# Patient Record
Sex: Female | Born: 1950 | Race: White | Hispanic: No | Marital: Married | Smoking: Never smoker
Health system: Southern US, Community
[De-identification: ages and names within clinical notes are randomized; demographics above are authoritative.]

## PROBLEM LIST (undated history)

## (undated) DIAGNOSIS — E7800 Pure hypercholesterolemia, unspecified: Secondary | ICD-10-CM

## (undated) DIAGNOSIS — N200 Calculus of kidney: Secondary | ICD-10-CM

## (undated) DIAGNOSIS — R55 Syncope and collapse: Secondary | ICD-10-CM

## (undated) DIAGNOSIS — Z860100 Personal history of colon polyps, unspecified: Secondary | ICD-10-CM

## (undated) DIAGNOSIS — N8111 Cystocele, midline: Secondary | ICD-10-CM

## (undated) DIAGNOSIS — I639 Cerebral infarction, unspecified: Secondary | ICD-10-CM

## (undated) DIAGNOSIS — M1990 Unspecified osteoarthritis, unspecified site: Secondary | ICD-10-CM

## (undated) DIAGNOSIS — J309 Allergic rhinitis, unspecified: Secondary | ICD-10-CM

## (undated) DIAGNOSIS — K219 Gastro-esophageal reflux disease without esophagitis: Secondary | ICD-10-CM

## (undated) DIAGNOSIS — J45909 Unspecified asthma, uncomplicated: Secondary | ICD-10-CM

## (undated) DIAGNOSIS — L719 Rosacea, unspecified: Secondary | ICD-10-CM

## (undated) DIAGNOSIS — F411 Generalized anxiety disorder: Secondary | ICD-10-CM

## (undated) DIAGNOSIS — I10 Essential (primary) hypertension: Secondary | ICD-10-CM

## (undated) DIAGNOSIS — H8110 Benign paroxysmal vertigo, unspecified ear: Secondary | ICD-10-CM

## (undated) DIAGNOSIS — M19049 Primary osteoarthritis, unspecified hand: Secondary | ICD-10-CM

## (undated) HISTORY — DX: Syncope and collapse: R55

## (undated) HISTORY — DX: Unspecified osteoarthritis, unspecified site: M19.90

## (undated) HISTORY — DX: Generalized anxiety disorder: F41.1

## (undated) HISTORY — DX: Calculus of kidney: N20.0

## (undated) HISTORY — DX: Rosacea, unspecified: L71.9

## (undated) HISTORY — DX: Benign paroxysmal vertigo, unspecified ear: H81.10

## (undated) HISTORY — PX: PARTIAL HYSTERECTOMY: SHX80

## (undated) HISTORY — DX: Cerebral infarction, unspecified: I63.9

## (undated) HISTORY — DX: Personal history of colon polyps, unspecified: Z86.0100

## (undated) HISTORY — DX: Pure hypercholesterolemia, unspecified: E78.00

## (undated) HISTORY — PX: SHOULDER SURGERY: SHX246

## (undated) HISTORY — DX: Gastro-esophageal reflux disease without esophagitis: K21.9

## (undated) HISTORY — DX: Allergic rhinitis, unspecified: J30.9

## (undated) HISTORY — PX: BUNIONECTOMY: SHX129

## (undated) HISTORY — PX: KNEE SURGERY: SHX244

## (undated) HISTORY — DX: Unspecified asthma, uncomplicated: J45.909

## (undated) HISTORY — DX: Primary osteoarthritis, unspecified hand: M19.049

## (undated) HISTORY — PX: CARDIAC CATHETERIZATION: SHX172

## (undated) HISTORY — DX: Cystocele, midline: N81.11

## (undated) HISTORY — DX: Personal history of colonic polyps: Z86.010

## (undated) HISTORY — DX: Essential (primary) hypertension: I10

---

## 2005-10-01 ENCOUNTER — Ambulatory Visit: Payer: Self-pay | Admitting: Unknown Physician Specialty

## 2006-01-08 ENCOUNTER — Ambulatory Visit (HOSPITAL_COMMUNITY): Admission: RE | Admit: 2006-01-08 | Discharge: 2006-01-08 | Payer: Self-pay | Admitting: *Deleted

## 2009-02-04 DIAGNOSIS — H8110 Benign paroxysmal vertigo, unspecified ear: Secondary | ICD-10-CM | POA: Insufficient documentation

## 2010-06-01 DEATH — deceased

## 2011-01-19 ENCOUNTER — Ambulatory Visit: Payer: Self-pay | Admitting: Unknown Physician Specialty

## 2011-03-11 ENCOUNTER — Ambulatory Visit: Payer: Self-pay | Admitting: Unknown Physician Specialty

## 2012-02-29 ENCOUNTER — Ambulatory Visit: Payer: Self-pay | Admitting: Family Medicine

## 2012-03-07 ENCOUNTER — Ambulatory Visit: Payer: Self-pay | Admitting: Family Medicine

## 2012-03-07 LAB — HM MAMMOGRAPHY

## 2012-07-29 ENCOUNTER — Ambulatory Visit: Payer: Self-pay | Admitting: Unknown Physician Specialty

## 2012-07-29 LAB — HM COLONOSCOPY

## 2013-11-09 LAB — CBC AND DIFFERENTIAL
HEMATOCRIT: 40 % (ref 36–46)
HEMOGLOBIN: 13.6 g/dL (ref 12.0–16.0)
Platelets: 266 10*3/uL (ref 150–399)
WBC: 5.7 10^3/mL

## 2013-11-09 LAB — LIPID PANEL: Cholesterol: 4 mg/dL (ref 0–200)

## 2013-11-09 LAB — BASIC METABOLIC PANEL: Glucose: 90 mg/dL

## 2013-11-13 ENCOUNTER — Encounter: Payer: Self-pay | Admitting: *Deleted

## 2013-11-14 ENCOUNTER — Ambulatory Visit (INDEPENDENT_AMBULATORY_CARE_PROVIDER_SITE_OTHER): Payer: 59 | Admitting: Cardiovascular Disease

## 2013-11-14 ENCOUNTER — Encounter: Payer: Self-pay | Admitting: Cardiovascular Disease

## 2013-11-14 ENCOUNTER — Encounter (INDEPENDENT_AMBULATORY_CARE_PROVIDER_SITE_OTHER): Payer: Self-pay

## 2013-11-14 VITALS — BP 151/84 | HR 77 | Ht 68.0 in | Wt 172.2 lb

## 2013-11-14 DIAGNOSIS — R55 Syncope and collapse: Secondary | ICD-10-CM

## 2013-11-14 DIAGNOSIS — T671XXA Heat syncope, initial encounter: Secondary | ICD-10-CM

## 2013-11-14 DIAGNOSIS — R002 Palpitations: Secondary | ICD-10-CM

## 2013-11-14 DIAGNOSIS — Z5189 Encounter for other specified aftercare: Secondary | ICD-10-CM

## 2013-11-14 NOTE — Patient Instructions (Signed)
Your physician has requested that you have an echocardiogram. Echocardiography is a painless test that uses sound waves to create images of your heart. It provides your doctor with information about the size and shape of your heart and how well your heart's chambers and valves are working. This procedure takes approximately one hour. There are no restrictions for this procedure.  Your physician has requested that you have a carotid duplex. This test is an ultrasound of the carotid arteries in your neck. It looks at blood flow through these arteries that supply the brain with blood. Allow one hour for this exam. There are no restrictions or special instructions.  Your physician has recommended that you wear an event monitor. Event monitors are medical devices that record the heart's electrical activity. Doctors most often [place] these monitors to diagnose arrhythmias. Arrhythmias are problems with the speed or rhythm of the heartbeat. The monitor is a small, portable device. You can wear one while you do your normal daily activities. This is usually used to diagnose what is causing palpitations/syncope (passing out).   Your physician recommends that you schedule a follow-up appointment in:  As needed

## 2013-11-15 DIAGNOSIS — R55 Syncope and collapse: Secondary | ICD-10-CM | POA: Insufficient documentation

## 2013-11-15 NOTE — Assessment & Plan Note (Signed)
She is not orthostatic today. Most likely the syncope was vasovagal especially with her prior history of presyncope during blood draw. However, there was no preceding incidents or events leading to the current syncopal episode. Thus, I think it is important to exclude an arrhythmic events especially with her family history of atrial fibrillation. It has fibrillation by itself does not cause syncope. However, post conversion pauses can do that. I requested an echocardiogram, carotid Doppler and a 30 day outpatient telemetry for evaluation. If above workup is unremarkable, no further testing is indicated.

## 2013-11-15 NOTE — Progress Notes (Signed)
Primary care physician: Dr. Elease Hashimoto  HPI  This is a pleasant 63 year old female who was referred for evaluation of syncope. She reports previous cardiac evaluation in 2007 by Dr. Jenne Campus. She had cardiac catheterization done with no significant disease. Other than hypertension, she has no chronic medical conditions. She does have family history of atrial fibrillation as both parents had there. She is not a smoker. Last week she woke up at her usual time around 5:00 to get ready to go to work. She was walking to the kitchen and all the sudden she felt dizzy with loss of consciousness. She fell. Her husband came in she woke up after that. There was no seizure activities. No tongue biting or bowel/bladder incontinence. She was not confused after the event. She had no other symptoms preceding that. She did have abdominal cramping and had to have a bowel movement after the episode. She does complain of intermittent palpitations and occasional tachycardia but these usually brief and did not happen around the time of the syncopal episode. She denies any chest pain, shortness of breath or orthopnea. She is physically active and walks almost daily for exercise. She reports previous presyncopal episode few years ago during blood draw.  Allergies  Allergen Reactions  . Codeine      Current Outpatient Prescriptions on File Prior to Visit  Medication Sig Dispense Refill  . cholecalciferol (VITAMIN D) 1000 UNITS tablet Take 1,000 Units by mouth daily.      . cloNIDine (CATAPRES) 0.2 MG tablet Take 0.2 mg by mouth daily.       No current facility-administered medications on file prior to visit.     Past Medical History  Diagnosis Date  . Syncope and collapse   . Hypertension   . Anxiety state, unspecified   . Osteoarthrosis, unspecified whether generalized or localized, unspecified site   . Esophageal reflux   . Cystocele, midline   . Hx of colonic polyps   . Rosacea   . Calculus of kidney   .  Osteoarthrosis, unspecified whether generalized or localized, hand   . Allergic rhinitis, cause unspecified   . Pure hypercholesterolemia   . Asthma   . Benign paroxysmal positional vertigo   . Stroke      Past Surgical History  Procedure Laterality Date  . Cardiac catheterization      MC  . Partial hysterectomy    . Knee surgery    . Bunionectomy    . Knee surgery    . Shoulder surgery       Family History  Problem Relation Age of Onset  . Arrhythmia Mother   . Heart murmur Mother   . Arrhythmia Father      History   Social History  . Marital Status: Single    Spouse Name: N/A    Number of Children: N/A  . Years of Education: N/A   Occupational History  . Not on file.   Social History Main Topics  . Smoking status: Never Smoker   . Smokeless tobacco: Not on file  . Alcohol Use: Yes  . Drug Use: No  . Sexual Activity: Not on file   Other Topics Concern  . Not on file   Social History Narrative  . No narrative on file     ROS A 10 point review of system was performed. It is negative other than that mentioned in the history of present illness.   PHYSICAL EXAM   BP 151/84  Pulse 77  Ht 5'  8" (1.727 m)  Wt 172 lb 4 oz (78.132 kg)  BMI 26.20 kg/m2 Constitutional: She is oriented to person, place, and time. She appears well-developed and well-nourished. No distress.  HENT: No nasal discharge.  Head: Normocephalic and atraumatic.  Eyes: Pupils are equal and round. No discharge.  Neck: Normal range of motion. Neck supple. No JVD present. No thyromegaly present.  Cardiovascular: Normal rate, regular rhythm, normal heart sounds. Exam reveals no gallop and no friction rub. No murmur heard.  Pulmonary/Chest: Effort normal and breath sounds normal. No stridor. No respiratory distress. She has no wheezes. She has no rales. She exhibits no tenderness.  Abdominal: Soft. Bowel sounds are normal. She exhibits no distension. There is no tenderness. There is no  rebound and no guarding.  Musculoskeletal: Normal range of motion. She exhibits no edema and no tenderness.  Neurological: She is alert and oriented to person, place, and time. Coordination normal.  Skin: Skin is warm and dry. No rash noted. She is not diaphoretic. No erythema. No pallor.  Psychiatric: She has a normal mood and affect. Her behavior is normal. Judgment and thought content normal.     [id number]  Rhythm  -  Nonspecific T-abnormality.   ABNORMAL     ASSESSMENT AND PLAN

## 2013-11-20 DIAGNOSIS — I498 Other specified cardiac arrhythmias: Secondary | ICD-10-CM

## 2013-11-23 ENCOUNTER — Encounter (INDEPENDENT_AMBULATORY_CARE_PROVIDER_SITE_OTHER): Payer: 59

## 2013-11-23 ENCOUNTER — Other Ambulatory Visit: Payer: Self-pay

## 2013-11-23 ENCOUNTER — Other Ambulatory Visit (INDEPENDENT_AMBULATORY_CARE_PROVIDER_SITE_OTHER): Payer: 59

## 2013-11-23 DIAGNOSIS — I6529 Occlusion and stenosis of unspecified carotid artery: Secondary | ICD-10-CM

## 2013-11-23 DIAGNOSIS — R55 Syncope and collapse: Secondary | ICD-10-CM

## 2013-11-29 ENCOUNTER — Encounter: Payer: Self-pay | Admitting: Cardiovascular Disease

## 2013-11-29 ENCOUNTER — Ambulatory Visit: Payer: Self-pay | Admitting: Cardiovascular Disease

## 2013-11-29 DIAGNOSIS — E785 Hyperlipidemia, unspecified: Secondary | ICD-10-CM

## 2013-11-29 MED ORDER — ATORVASTATIN CALCIUM 10 MG PO TABS: 10.0000 mg | ORAL_TABLET | Freq: Every day | ORAL | Status: DC

## 2013-11-29 NOTE — Telephone Encounter (Signed)
Dr. Kirke CorinArida reviewed labs from patient  Per written order from Dr. Kirke CorinArida: Her 10 year risk of CV disease is 8.8% (which is above 7.5%) Thus she should consider atorvastatin 10 mg daily and repeat labs in 6 weeks   Patient verbalized understanding

## 2013-12-13 ENCOUNTER — Telehealth: Payer: Self-pay

## 2013-12-13 MED ORDER — ATORVASTATIN CALCIUM 10 MG PO TABS
10.0000 mg | ORAL_TABLET | Freq: Every day | ORAL | Status: DC
Start: 2013-12-13 — End: 2014-10-17

## 2013-12-13 NOTE — Telephone Encounter (Signed)
Patient called and stated that the electrodes are breaking her skin out horribly  She has no where else to put them  She feels that she has had at least two episodes while wearing her monitor   She wonders if it can be sent back one week early

## 2013-12-13 NOTE — Telephone Encounter (Signed)
Please see note below. 

## 2013-12-13 NOTE — Telephone Encounter (Signed)
Pt states she is currently wearing a monitor and "has no more skin left" has gotten new sensitive skin leads and she has lots of bumps and her skin is "raw". States she has a week left. Please call.

## 2013-12-13 NOTE — Telephone Encounter (Signed)
LVM 10/14 

## 2013-12-14 NOTE — Telephone Encounter (Signed)
Informed patient that Dr. Kirke CorinArida said she send her monitor back early  Patient verbalized understanding

## 2013-12-14 NOTE — Telephone Encounter (Signed)
That's fine

## 2013-12-14 NOTE — Telephone Encounter (Signed)
She would like to stop it now.  Is that ok?

## 2013-12-14 NOTE — Telephone Encounter (Signed)
Ok. Wear it as long as she can tolerate.

## 2014-01-08 ENCOUNTER — Ambulatory Visit (INDEPENDENT_AMBULATORY_CARE_PROVIDER_SITE_OTHER): Payer: 59

## 2014-01-08 ENCOUNTER — Other Ambulatory Visit: Payer: Self-pay

## 2014-01-08 DIAGNOSIS — R55 Syncope and collapse: Secondary | ICD-10-CM

## 2014-01-08 DIAGNOSIS — R002 Palpitations: Secondary | ICD-10-CM

## 2014-01-10 ENCOUNTER — Other Ambulatory Visit (INDEPENDENT_AMBULATORY_CARE_PROVIDER_SITE_OTHER): Payer: 59

## 2014-01-10 DIAGNOSIS — E785 Hyperlipidemia, unspecified: Secondary | ICD-10-CM

## 2014-01-11 LAB — HEPATIC FUNCTION PANEL
ALK PHOS: 93 IU/L (ref 39–117)
ALT: 29 IU/L (ref 0–32)
AST: 18 IU/L (ref 0–40)
Albumin: 4.4 g/dL (ref 3.6–4.8)
Bilirubin, Direct: 0.1 mg/dL (ref 0.00–0.40)
Total Bilirubin: 0.3 mg/dL (ref 0.0–1.2)
Total Protein: 6.6 g/dL (ref 6.0–8.5)

## 2014-01-11 LAB — LIPID PANEL
CHOLESTEROL TOTAL: 146 mg/dL (ref 100–199)
Chol/HDL Ratio: 2.7 ratio units (ref 0.0–4.4)
HDL: 55 mg/dL (ref >39)
LDL Calculated: 71 mg/dL (ref 0–99)
TRIGLYCERIDES: 102 mg/dL (ref 0–149)
VLDL CHOLESTEROL CAL: 20 mg/dL (ref 5–40)

## 2014-01-15 ENCOUNTER — Telehealth: Payer: Self-pay

## 2014-01-15 NOTE — Telephone Encounter (Signed)
See result note.  

## 2014-01-15 NOTE — Telephone Encounter (Signed)
Pt returning call regarding lab results 

## 2014-06-24 NOTE — Op Note (Signed)
PATIENT NAME:  Maria Carrillo, Maria Carrillo MR#:  [id number] DATE OF BIRTH:  [birth year]  DATE OF PROCEDURE:  03/11/2011  PREOPERATIVE DIAGNOSIS: Torn medial meniscus right knee and possible torn lateral meniscus.   POSTOPERATIVE DIAGNOSIS: Torn medial meniscus, right knee along with multiple chondral lesions.   PROCEDURE: Arthroscopic partial medial meniscectomy plus debridement of chondral lesions.   SURGEON: Alda BertholdHarold B. Arval Brandstetter Jr., MD  ANESTHESIA: General.   HISTORY: Patient had a long history of right knee pain. Her plain films did not reveal any significant abnormality. MRI was consistent with a torn medial meniscus and possible lateral meniscus tear. Patient was ultimately brought in for arthroscopy due to her persistent symptoms despite conservative treatment.   DESCRIPTION OF PROCEDURE: Patient was taken to the Operating Room where satisfactory general anesthesia was achieved. A tourniquet and legholder were applied to the right thigh and then the left lower extremity was supported with a well legholder. The right knee was prepped and draped in the usual fashion for an arthroscopic procedure. An inflow cannula was inserted superomedially and the joint was distended with lactated Ringer's. We used the Mitek fluid pump to facilitate joint distention.   The scope was introduced through an inferolateral puncture wound and a probe through an inferomedial puncture wound.   Inspection of the medial compartment revealed that the patient did have a tear of the posterior horn of the medial meniscus. It looked like she had had an inferior leaf tear with a remaining flap. I went ahead and resected the torn portions of the posterior horn of medial meniscus using basket biter and a motorized resector. After the rim was contoured, I turned my attention to the chondral surfaces. The patient had a grade 3 lesion in the mid weight-bearing portion of the medial femoral condyle. This was debrided with a Turbowhisker and then  coblated with a [employer] ArthroCare wand. Inspection of the intercondylar notch revealed that the cruciates were intact. Inspection of the lateral compartment revealed a little fraying along the inner border of the lateral meniscus. The patient had about a 5 x 7 chondral lesion grade 3, almost grade 4, chondral lesion in the mid weight-bearing portion of the lateral femoral condyle. This was debrided with a Turbowhisker and then I used a curette to remove the roughened articular cartilage in the base of the lesion. I then used a small synovial resector to further debride the lesion and then I microfractured the lesion with a 45 degrees awl. Further debridement was performed with a Turbowhisker.   I used Turbowhisker to debride the frayed inner portion of the lateral meniscus.   I then introduced the scope through the intercondylar notch into the posterior recess of the medial and lateral compartments. No additional pathology was noted. The trochlear groove was reasonably smooth. Inspection of the patella was primarily carried out from the superomedial portal. There were grade 2 to 3 changes in the central portion of the patella. I debrided this with a Turbowhisker and then coblated this area with a [employer] ArthroCare wand.   I then decompressed the fluid in the joint through the cannulas. The cannulas were removed and the puncture wounds were closed with 3-0 nylon in vertical mattress fashion. Several milliliters of 0.5% Marcaine with epinephrine were injected about each puncture wound then Betadine was applied to the wounds followed by a sterile dressing and an ice pack.   The patient was then awakened and transferred to her stretcher bed. She was taken to recovery room in satisfactory condition.  The tourniquet incidentally was not inflated during the course of the procedure. Blood loss was negligible.   ____________________________ Alda Berthold., MD hbk:cms D: 03/11/2011 09:49:46  ET T: 03/11/2011 10:09:37 ET JOB#: [id number]  cc: Alda Berthold., MD, <Dictator> Randon Goldsmith, Montez Hageman MD ELECTRONICALLY SIGNED 03/29/2011 15:07

## 2014-07-02 ENCOUNTER — Other Ambulatory Visit: Payer: Self-pay

## 2014-07-02 DIAGNOSIS — Z1231 Encounter for screening mammogram for malignant neoplasm of breast: Secondary | ICD-10-CM

## 2014-07-17 ENCOUNTER — Ambulatory Visit
Admission: RE | Admit: 2014-07-17 | Discharge: 2014-07-17 | Disposition: A | Discharge status: Home / Self Care | Payer: 59 | Source: Ambulatory Visit | Attending: Obstetrics & Gynecology | Admitting: Obstetrics & Gynecology

## 2014-07-17 DIAGNOSIS — Z1231 Encounter for screening mammogram for malignant neoplasm of breast: Secondary | ICD-10-CM | POA: Diagnosis present

## 2014-07-23 ENCOUNTER — Other Ambulatory Visit: Payer: Self-pay | Admitting: Obstetrics & Gynecology

## 2014-07-23 DIAGNOSIS — N6489 Other specified disorders of breast: Secondary | ICD-10-CM

## 2014-07-23 DIAGNOSIS — R928 Other abnormal and inconclusive findings on diagnostic imaging of breast: Secondary | ICD-10-CM

## 2014-07-24 ENCOUNTER — Ambulatory Visit
Admission: RE | Admit: 2014-07-24 | Discharge: 2014-07-24 | Disposition: A | Discharge status: Home / Self Care | Payer: 59 | Source: Ambulatory Visit | Attending: Obstetrics & Gynecology | Admitting: Obstetrics & Gynecology

## 2014-07-24 ENCOUNTER — Ambulatory Visit: Payer: 59

## 2014-07-24 DIAGNOSIS — N6489 Other specified disorders of breast: Secondary | ICD-10-CM

## 2014-07-24 DIAGNOSIS — R928 Other abnormal and inconclusive findings on diagnostic imaging of breast: Secondary | ICD-10-CM | POA: Insufficient documentation

## 2014-08-30 ENCOUNTER — Ambulatory Visit (INDEPENDENT_AMBULATORY_CARE_PROVIDER_SITE_OTHER): Payer: 59 | Admitting: Family Medicine

## 2014-08-30 ENCOUNTER — Encounter: Payer: Self-pay | Admitting: Family Medicine

## 2014-08-30 VITALS — BP 118/72 | HR 68 | Temp 97.7°F | Resp 16 | Ht 68.0 in | Wt 177.0 lb

## 2014-08-30 DIAGNOSIS — E038 Other specified hypothyroidism: Secondary | ICD-10-CM | POA: Diagnosis not present

## 2014-08-30 DIAGNOSIS — M1990 Unspecified osteoarthritis, unspecified site: Secondary | ICD-10-CM | POA: Insufficient documentation

## 2014-08-30 DIAGNOSIS — M19049 Primary osteoarthritis, unspecified hand: Secondary | ICD-10-CM | POA: Insufficient documentation

## 2014-08-30 DIAGNOSIS — E039 Hypothyroidism, unspecified: Secondary | ICD-10-CM | POA: Insufficient documentation

## 2014-08-30 DIAGNOSIS — K219 Gastro-esophageal reflux disease without esophagitis: Secondary | ICD-10-CM | POA: Insufficient documentation

## 2014-08-30 DIAGNOSIS — R202 Paresthesia of skin: Secondary | ICD-10-CM | POA: Diagnosis not present

## 2014-08-30 DIAGNOSIS — L719 Rosacea, unspecified: Secondary | ICD-10-CM | POA: Insufficient documentation

## 2014-08-30 DIAGNOSIS — I10 Essential (primary) hypertension: Secondary | ICD-10-CM | POA: Insufficient documentation

## 2014-08-30 DIAGNOSIS — F419 Anxiety disorder, unspecified: Secondary | ICD-10-CM | POA: Insufficient documentation

## 2014-08-30 DIAGNOSIS — D649 Anemia, unspecified: Secondary | ICD-10-CM

## 2014-08-30 NOTE — Progress Notes (Signed)
Subjective:    Patient ID: Maria Carrillo, female    DOB: 1950-10-21, 64 y.o.   MRN: [id number]  Anemia Presents for initial visit. The condition has lasted for 2 days. Symptoms include bruises/bleeds easily (bruises easily), malaise/fatigue, paresthesias and pica (Pt states she likes to chew ice). There has been no abdominal pain, anorexia, confusion, fever, leg swelling, light-headedness, pallor, palpitations or weight loss. Signs of blood loss that are not present include hematemesis, hematochezia, melena and menorrhagia. There is no past history of FOBT (at OB/GYN- was negative April or May 2016.).  Pt tried to give blood on Tuesday, and could not give due to low hgb. Pt states she has been feeling fatigued lately, and is requesting labs to be checked.    Review of Systems  Constitutional: Positive for malaise/fatigue. Negative for fever and weight loss.  Cardiovascular: Negative for palpitations.  Gastrointestinal: Negative for abdominal pain, melena, hematochezia, anorexia and hematemesis.  Genitourinary: Negative for menorrhagia.  Skin: Negative for pallor.  Neurological: Positive for paresthesias. Negative for light-headedness.  Hematological: Bruises/bleeds easily (bruises easily).  Psychiatric/Behavioral: Negative for confusion.   BP 118/72 mmHg  Pulse 68  Temp(Src) 97.7 F (36.5 C) (Oral)  Resp 16  Ht 5\' 8"  (1.727 m)  Wt 177 lb (80.287 kg)  BMI 26.92 kg/m2   Patient Active Problem List   Diagnosis Date Noted  . Anxiety 08/30/2014  . Acid reflux 08/30/2014  . BP (high blood pressure) 08/30/2014  . Arthritis of hand, degenerative 08/30/2014  . Arthritis, degenerative 08/30/2014  . Subclinical hypothyroidism 08/30/2014  . Rosacea 08/30/2014  . Faintness 11/15/2013  . Benign paroxysmal positional nystagmus 02/04/2009  . Asthma, exogenous 11/15/2008  . Allergic rhinitis 10/10/2008  . History of colon polyps 10/10/2008  . Hypercholesteremia 10/10/2008   Past Medical  History  Diagnosis Date  . Syncope and collapse   . Hypertension   . Anxiety state, unspecified   . Osteoarthrosis, unspecified whether generalized or localized, unspecified site   . Esophageal reflux   . Cystocele, midline   . Hx of colonic polyps   . Rosacea   . Calculus of kidney   . Osteoarthrosis, unspecified whether generalized or localized, hand   . Allergic rhinitis, cause unspecified   . Pure hypercholesterolemia   . Asthma   . Benign paroxysmal positional vertigo   . Stroke    Current Outpatient Prescriptions on File Prior to Visit  Medication Sig  . atorvastatin (LIPITOR) 10 MG tablet Take 1 tablet (10 mg total) by mouth daily.  . cholecalciferol (VITAMIN D) 1000 UNITS tablet Take 1,000 Units by mouth daily.  . cloNIDine (CATAPRES) 0.2 MG tablet Take 0.2 mg by mouth daily.   No current facility-administered medications on file prior to visit.   Allergies  Allergen Reactions  . Codeine Itching   Past Surgical History  Procedure Laterality Date  . Cardiac catheterization      MC  . Partial hysterectomy    . Knee surgery    . Bunionectomy    . Knee surgery    . Shoulder surgery     History   Social History  . Marital Status: Married    Spouse Name: N/A  . Number of Children: N/A  . Years of Education: N/A   Occupational History  . Not on file.   Social History Main Topics  . Smoking status: Never Smoker   . Smokeless tobacco: Never Used  . Alcohol Use: Yes     Comment: occasionally  .  Drug Use: No  . Sexual Activity: Not on file   Other Topics Concern  . Not on file   Social History Narrative   Family History  Problem Relation Age of Onset  . Arrhythmia Mother   . Heart murmur Mother   . Arrhythmia Father   . Breast cancer Maternal Aunt       Objective:   Physical Exam  Constitutional: She is oriented to person, place, and time. She appears well-developed and well-nourished.  Neurological: She is alert and oriented to person, place,  and time.  Psychiatric: She has a normal mood and affect. Her behavior is normal.   BP 118/72 mmHg  Pulse 68  Temp(Src) 97.7 F (36.5 C) (Oral)  Resp 16  Ht  (1.727 m)  Wt 177 lb (80.287 kg)  BMI 26.92 kg/m2        Assessment & Plan:   1. Anemia, unspecified anemia type Will check labs.  - CBC with Differential/Platelet - Ferritin - Iron and TIBC  2. Paresthesia  Will check labs.  - Vitamin B12  3. Subclinical hypothyroidism Will check labs.   - TSH  Lorie Phenix, MD

## 2014-08-31 ENCOUNTER — Telehealth: Payer: Self-pay

## 2014-08-31 LAB — CBC WITH DIFFERENTIAL/PLATELET
Basophils Absolute: 0.1 10*3/uL (ref 0.0–0.2)
Basos: 1 %
EOS (ABSOLUTE): 0.2 10*3/uL (ref 0.0–0.4)
Eos: 3 %
Hematocrit: 38 % (ref 34.0–46.6)
Hemoglobin: 12.8 g/dL (ref 11.1–15.9)
IMMATURE GRANULOCYTES: 0 %
Immature Grans (Abs): 0 10*3/uL (ref 0.0–0.1)
LYMPHS: 35 %
Lymphocytes Absolute: 2.4 10*3/uL (ref 0.7–3.1)
MCH: 28.9 pg (ref 26.6–33.0)
MCHC: 33.7 g/dL (ref 31.5–35.7)
MCV: 86 fL (ref 79–97)
MONOCYTES: 8 %
Monocytes Absolute: 0.5 10*3/uL (ref 0.1–0.9)
Neutrophils Absolute: 3.6 10*3/uL (ref 1.4–7.0)
Neutrophils: 53 %
PLATELETS: 260 10*3/uL (ref 150–379)
RBC: 4.43 x10E6/uL (ref 3.77–5.28)
RDW: 13.6 % (ref 12.3–15.4)
WBC: 6.8 10*3/uL (ref 3.4–10.8)

## 2014-08-31 LAB — VITAMIN B12: Vitamin B-12: 360 pg/mL (ref 211–946)

## 2014-08-31 LAB — FERRITIN: FERRITIN: 103 ng/mL (ref 15–150)

## 2014-08-31 LAB — IRON AND TIBC
Iron Saturation: 19 % (ref 15–55)
Iron: 60 ug/dL (ref 27–139)
Total Iron Binding Capacity: 315 ug/dL (ref 250–450)
UIBC: 255 ug/dL (ref 118–369)

## 2014-08-31 LAB — TSH: TSH: 3.06 u[IU]/mL (ref 0.450–4.500)

## 2014-08-31 NOTE — Telephone Encounter (Signed)
Advised pt of lab results. Pt verbally acknowledges understanding. Aunisty Reali Drozdowski, CMA   

## 2014-08-31 NOTE — Telephone Encounter (Signed)
-----   Message from Lorie PhenixNancy Maloney, MD sent at 08/31/2014  6:53 AM EDT ----- Labs stable.  Not anemic.  Iron level ok.  B12 is low normal. Would add B12 1000 mcg daily. Have a great holiday weekend. Thanks- Dr. Elease HashimotoMaloney

## 2014-10-17 ENCOUNTER — Other Ambulatory Visit: Payer: Self-pay | Admitting: Cardiovascular Disease

## 2015-01-09 ENCOUNTER — Other Ambulatory Visit: Payer: Self-pay | Admitting: Orthopedic Surgery

## 2015-01-09 ENCOUNTER — Ambulatory Visit
Admission: RE | Admit: 2015-01-09 | Discharge: 2015-01-09 | Disposition: A | Discharge status: Home / Self Care | Payer: 59 | Source: Ambulatory Visit | Attending: Orthopedic Surgery | Admitting: Orthopedic Surgery

## 2015-01-09 DIAGNOSIS — R52 Pain, unspecified: Secondary | ICD-10-CM

## 2015-01-09 DIAGNOSIS — M25551 Pain in right hip: Secondary | ICD-10-CM | POA: Diagnosis not present

## 2015-07-04 ENCOUNTER — Other Ambulatory Visit: Payer: Self-pay | Admitting: Obstetrics & Gynecology

## 2015-07-04 DIAGNOSIS — Z1231 Encounter for screening mammogram for malignant neoplasm of breast: Secondary | ICD-10-CM

## 2015-07-24 ENCOUNTER — Ambulatory Visit (INDEPENDENT_AMBULATORY_CARE_PROVIDER_SITE_OTHER): Payer: 59 | Admitting: Family Medicine

## 2015-07-24 ENCOUNTER — Encounter: Payer: Self-pay | Admitting: Family Medicine

## 2015-07-24 VITALS — BP 138/82 | HR 80 | Temp 98.2°F | Resp 16 | Ht 68.0 in | Wt 180.0 lb

## 2015-07-24 DIAGNOSIS — E7800 Pure hypercholesterolemia, unspecified: Secondary | ICD-10-CM

## 2015-07-24 DIAGNOSIS — I10 Essential (primary) hypertension: Secondary | ICD-10-CM | POA: Diagnosis not present

## 2015-07-24 DIAGNOSIS — N951 Menopausal and female climacteric states: Secondary | ICD-10-CM | POA: Diagnosis not present

## 2015-07-24 DIAGNOSIS — E039 Hypothyroidism, unspecified: Secondary | ICD-10-CM

## 2015-07-24 DIAGNOSIS — R319 Hematuria, unspecified: Secondary | ICD-10-CM

## 2015-07-24 DIAGNOSIS — Z0000 Encounter for general adult medical examination without abnormal findings: Secondary | ICD-10-CM | POA: Diagnosis not present

## 2015-07-24 DIAGNOSIS — E038 Other specified hypothyroidism: Secondary | ICD-10-CM

## 2015-07-24 LAB — POCT URINALYSIS DIPSTICK
Bilirubin, UA: NEGATIVE
GLUCOSE UA: NEGATIVE
Ketones, UA: NEGATIVE
Leukocytes, UA: NEGATIVE
NITRITE UA: NEGATIVE
PH UA: 7
PROTEIN UA: NEGATIVE
SPEC GRAV UA: 1.01
UROBILINOGEN UA: 0.2

## 2015-07-24 MED ORDER — HYDROCHLOROTHIAZIDE 12.5 MG PO TABS: 12.5000 mg | ORAL_TABLET | Freq: Every day | ORAL | Status: AC

## 2015-07-24 NOTE — Progress Notes (Signed)
Patient ID: Maria Carrillo, female   DOB: 1951-02-13, 65 y.o.   MRN: [id number]       Patient: Maria Carrillo, Female    DOB: 1950-09-13, 65 y.o.   MRN: [id number] Visit Date: 07/24/2015  Today's Provider: Margarita Rana, MD   Chief Complaint  Patient presents with  . Annual Exam   Subjective:    Annual physical exam [name] is a 65 y.o. female who presents today for health maintenance and complete physical. She feels well. She reports exercising daily. She reports she is sleeping well.  05/03/14 CPE 07/24/14 Mammogram-BI-RADS 1 07/29/12 Colonocopy-diverticulosis, recheck in 5 years, Dr. Vira Agar  -----------------------------------------------------------------   Hypertension, follow-up:  BP Readings from Last 3 Encounters:  07/24/15 138/82  08/30/14 118/72  11/14/13 151/84   Patient reports that she a few weeks ago at GYN and was told that her BP was elevated (140/80's per pt.) and to follow-up with PCP. She was last seen for hypertension 1 years ago.  BP at that visit was 118/72. Management changes since that visit include no changes. She reports excellent compliance with treatment. She is not having side effects. She is exercising. She is adherent to low salt diet.   Outside blood pressures are . She is experiencing none.  Patient denies chest pain and lower extremity edema.   Cardiovascular risk factors include none.  Use of agents associated with hypertension: none.    Weight trend: stable Wt Readings from Last 3 Encounters:  07/24/15 180 lb (81.647 kg)  08/30/14 177 lb (80.287 kg)  11/14/13 172 lb 4 oz (78.132 kg)   Current diet: in general, a "healthy" diet   ------------------------------------------------------------------------  Review of Systems  Constitutional: Negative.   HENT: Negative.   Eyes: Negative.   Respiratory: Negative.   Cardiovascular: Negative.   Gastrointestinal: Negative.   Endocrine: Negative.   Genitourinary: Negative.     Musculoskeletal: Negative.   Skin: Negative.   Allergic/Immunologic: Negative.   Neurological: Negative.   Hematological: Negative.   Psychiatric/Behavioral: Negative.     Social History      She  reports that she has never smoked. She has never used smokeless tobacco. She reports that she drinks alcohol. She reports that she does not use illicit drugs.       Social History   Social History  . Marital Status: Married    Spouse Name: N/A  . Number of Children: N/A  . Years of Education: N/A   Social History Main Topics  . Smoking status: Never Smoker   . Smokeless tobacco: Never Used  . Alcohol Use: Yes     Comment: ocasisonally  . Drug Use: No  . Sexual Activity: Not Asked   Other Topics Concern  . None   Social History Narrative    Past Medical History  Diagnosis Date  . Syncope and collapse   . Hypertension   . Anxiety state, unspecified   . Osteoarthrosis, unspecified whether generalized or localized, unspecified site   . Esophageal reflux   . Cystocele, midline   . Hx of colonic polyps   . Rosacea   . Calculus of kidney   . Osteoarthrosis, unspecified whether generalized or localized, hand   . Allergic rhinitis, cause unspecified   . Pure hypercholesterolemia   . Asthma   . Benign paroxysmal positional vertigo   . Stroke [facility])      Patient Active Problem List   Diagnosis Date Noted  . Anxiety 08/30/2014  . Acid reflux 08/30/2014  .  BP (high blood pressure) 08/30/2014  . Arthritis of hand, degenerative 08/30/2014  . Arthritis, degenerative 08/30/2014  . Subclinical hypothyroidism 08/30/2014  . Rosacea 08/30/2014  . Paresthesia 08/30/2014  . Anemia 08/30/2014  . Faintness 11/15/2013  . Benign paroxysmal positional nystagmus 02/04/2009  . Asthma, exogenous 11/15/2008  . Allergic rhinitis 10/10/2008  . History of colon polyps 10/10/2008  . Hypercholesteremia 10/10/2008    Past Surgical History  Procedure Laterality Date  . Cardiac  catheterization      [facility]  . Partial hysterectomy    . Knee surgery    . Bunionectomy    . Knee surgery    . Shoulder surgery      Family History        Family Status  Relation Status Death Age  . Mother Alive   . Father Deceased   . Sister Alive   . Brother Alive         Her family history includes Arrhythmia in her father and mother; Breast cancer in her maternal aunt; Heart murmur in her mother.    Allergies  Allergen Reactions  . Codeine Itching    Previous Medications   ATORVASTATIN (LIPITOR) 10 MG TABLET    Take 1 tablet by mouth  daily   CHOLECALCIFEROL (VITAMIN D) 1000 UNITS TABLET    Take 1,000 Units by mouth daily.   CLONIDINE (CATAPRES) 0.2 MG TABLET    Take 0.2 mg by mouth daily.   COENZYME Q-10 100 MG CAPSULE    Take 100 mg by mouth daily.     Patient Care Team: Margarita Rana, MD as PCP - General (Family Medicine)     Objective:   Vitals: BP 138/82 mmHg  Pulse 80  Temp(Src) 98.2 F (36.8 C) (Oral)  Resp 16  Ht 5' 8"  (1.727 m)  Wt 180 lb (81.647 kg)  BMI 27.38 kg/m2   Physical Exam  Constitutional: She is oriented to person, place, and time. She appears well-developed and well-nourished.  HENT:  Head: Normocephalic and atraumatic.  Right Ear: Tympanic membrane, external ear and ear canal normal.  Left Ear: Tympanic membrane, external ear and ear canal normal.  Nose: Nose normal.  Mouth/Throat: Uvula is midline, oropharynx is clear and moist and mucous membranes are normal.  Eyes: Conjunctivae, EOM and lids are normal. Pupils are equal, round, and reactive to light.  Neck: Trachea normal and normal range of motion. Neck supple. Carotid bruit is not present. No thyroid mass and no thyromegaly present.  Cardiovascular: Normal rate, regular rhythm and normal heart sounds.   Pulmonary/Chest: Effort normal and breath sounds normal.  Abdominal: Soft. Normal appearance and bowel sounds are normal. There is no hepatosplenomegaly. There is no tenderness.    Musculoskeletal: Normal range of motion.  Lymphadenopathy:    She has no cervical adenopathy.    She has no axillary adenopathy.  Neurological: She is alert and oriented to person, place, and time. She has normal strength. No cranial nerve deficit.  Skin: Skin is warm, dry and intact.  Psychiatric: She has a normal mood and affect. Her speech is normal and behavior is normal. Judgment and thought content normal. Cognition and memory are normal.    Depression Screen PHQ 2/9 Scores 07/24/2015  PHQ - 2 Score 0    Assessment & Plan:     Routine Health Maintenance and Physical Exam  Exercise Activities and Dietary recommendations Goals    None      Immunization History  Administered Date(s) Administered  .  Tdap 03/02/2006       1. Annual physical exam Stable. Patient advised to continue eating healthy and exercise daily. - POCT urinalysis dipstick  2. Hematuria F/U pending lab report. - Urine Microscopic  3. Essential hypertension Not at goal. Patient started on HCTZ 12.5 mg as below. Patient to call in 3 week to recheck met c after medication changes. Also, monitor bp and call if worsens or does not improve.   - hydrochlorothiazide (HYDRODIURIL) 12.5 MG tablet; Take 1 tablet (12.5 mg total) by mouth daily.  Dispense: 90 tablet; Refill: 3 - CBC with Differential/Platelet - Comprehensive metabolic panel  4. Subclinical hypothyroidism - TSH  5. Hypercholesteremia - Lipid Panel With LDL/HDL Ratio  6. Hot flashes, menopausal Stable. Patient advised to discontinue clonidine and see if symptoms return as is only taking once a day and may be causing rebound issues.     Patient seen and examined by Dr. Jerrell Belfast, and note scribed by Philbert Riser. Dimas, CMA. I have reviewed the document for accuracy and completeness and I agree with above. Jerrell Belfast, MD   --------------------------------------------------------------------

## 2015-07-24 NOTE — Patient Instructions (Signed)
Discontinue clonidine 0.2 mg and start hydrochlorothiazide 12.5 mg daily. Please call in 3 weeks for lab order.

## 2015-07-25 ENCOUNTER — Telehealth: Payer: Self-pay

## 2015-07-25 LAB — URINALYSIS, MICROSCOPIC ONLY
BACTERIA UA: NONE SEEN
CASTS: NONE SEEN /LPF

## 2015-07-25 NOTE — Telephone Encounter (Signed)
Left message to call back  

## 2015-07-25 NOTE — Telephone Encounter (Signed)
Advised patient of results.  

## 2015-07-25 NOTE — Telephone Encounter (Signed)
-----   Message from Lorie PhenixNancy Maloney, MD sent at 07/25/2015  7:24 AM EDT ----- Urine normal. Thanks.

## 2015-07-26 ENCOUNTER — Ambulatory Visit
Admission: RE | Admit: 2015-07-26 | Discharge: 2015-07-26 | Disposition: A | Discharge status: Home / Self Care | Payer: 59 | Source: Ambulatory Visit | Attending: Obstetrics & Gynecology | Admitting: Obstetrics & Gynecology

## 2015-07-26 ENCOUNTER — Other Ambulatory Visit: Payer: Self-pay | Admitting: Obstetrics & Gynecology

## 2015-07-26 DIAGNOSIS — Z1231 Encounter for screening mammogram for malignant neoplasm of breast: Secondary | ICD-10-CM | POA: Diagnosis present

## 2015-07-27 LAB — COMPREHENSIVE METABOLIC PANEL
ALT: 24 IU/L (ref 0–32)
AST: 15 IU/L (ref 0–40)
Albumin/Globulin Ratio: 1.7 (ref 1.2–2.2)
Albumin: 4.2 g/dL (ref 3.6–4.8)
Alkaline Phosphatase: 92 IU/L (ref 39–117)
BUN/Creatinine Ratio: 12 (ref 12–28)
BUN: 10 mg/dL (ref 8–27)
Bilirubin Total: 0.3 mg/dL (ref 0.0–1.2)
CALCIUM: 9.3 mg/dL (ref 8.7–10.3)
CO2: 24 mmol/L (ref 18–29)
CREATININE: 0.86 mg/dL (ref 0.57–1.00)
Chloride: 101 mmol/L (ref 96–106)
GFR calc Af Amer: 83 mL/min/{1.73_m2} (ref >59)
GFR, EST NON AFRICAN AMERICAN: 72 mL/min/{1.73_m2} (ref >59)
GLOBULIN, TOTAL: 2.5 g/dL (ref 1.5–4.5)
Glucose: 87 mg/dL (ref 65–99)
Potassium: 4.9 mmol/L (ref 3.5–5.2)
SODIUM: 140 mmol/L (ref 134–144)
TOTAL PROTEIN: 6.7 g/dL (ref 6.0–8.5)

## 2015-07-27 LAB — CBC WITH DIFFERENTIAL/PLATELET
Basophils Absolute: 0.1 10*3/uL (ref 0.0–0.2)
Basos: 1 %
EOS (ABSOLUTE): 0.2 10*3/uL (ref 0.0–0.4)
EOS: 4 %
HEMATOCRIT: 38.1 % (ref 34.0–46.6)
HEMOGLOBIN: 12.6 g/dL (ref 11.1–15.9)
IMMATURE GRANS (ABS): 0 10*3/uL (ref 0.0–0.1)
IMMATURE GRANULOCYTES: 0 %
LYMPHS: 35 %
Lymphocytes Absolute: 2.1 10*3/uL (ref 0.7–3.1)
MCH: 28.9 pg (ref 26.6–33.0)
MCHC: 33.1 g/dL (ref 31.5–35.7)
MCV: 87 fL (ref 79–97)
MONOCYTES: 6 %
MONOS ABS: 0.4 10*3/uL (ref 0.1–0.9)
Neutrophils Absolute: 3.2 10*3/uL (ref 1.4–7.0)
Neutrophils: 54 %
Platelets: 272 10*3/uL (ref 150–379)
RBC: 4.36 x10E6/uL (ref 3.77–5.28)
RDW: 13.6 % (ref 12.3–15.4)
WBC: 5.9 10*3/uL (ref 3.4–10.8)

## 2015-07-27 LAB — LIPID PANEL WITH LDL/HDL RATIO
Cholesterol, Total: 156 mg/dL (ref 100–199)
HDL: 50 mg/dL (ref >39)
LDL CALC: 77 mg/dL (ref 0–99)
LDL/HDL RATIO: 1.5 ratio (ref 0.0–3.2)
TRIGLYCERIDES: 144 mg/dL (ref 0–149)
VLDL Cholesterol Cal: 29 mg/dL (ref 5–40)

## 2015-07-27 LAB — TSH: TSH: 5.86 u[IU]/mL — ABNORMAL HIGH (ref 0.450–4.500)

## 2015-07-30 ENCOUNTER — Telehealth: Payer: Self-pay

## 2015-07-30 NOTE — Telephone Encounter (Signed)
-----   Message from Lorie PhenixNancy Maloney, MD sent at 07/27/2015  2:05 PM EDT ----- Labs stable. Thyroid slightly low but not enough to recommend treatment. Recheck TSH and T4 in 3 to 6 months.  Thanks.

## 2015-07-30 NOTE — Telephone Encounter (Signed)
Pt advised as directed below.   Thanks,   -Laura  

## 2015-08-01 ENCOUNTER — Encounter: Payer: Self-pay | Admitting: Physician Assistant

## 2015-08-01 ENCOUNTER — Encounter: Payer: Self-pay | Admitting: Family Medicine

## 2015-08-01 ENCOUNTER — Ambulatory Visit (INDEPENDENT_AMBULATORY_CARE_PROVIDER_SITE_OTHER): Payer: 59 | Admitting: Physician Assistant

## 2015-08-01 VITALS — BP 124/80 | HR 111 | Temp 98.1°F | Resp 16 | Ht 68.0 in | Wt 177.0 lb

## 2015-08-01 DIAGNOSIS — I456 Pre-excitation syndrome: Secondary | ICD-10-CM | POA: Diagnosis not present

## 2015-08-01 DIAGNOSIS — R002 Palpitations: Secondary | ICD-10-CM | POA: Diagnosis not present

## 2015-08-01 DIAGNOSIS — I10 Essential (primary) hypertension: Secondary | ICD-10-CM

## 2015-08-01 NOTE — Progress Notes (Addendum)
Patient: Maria Carrillo Female    DOB: [birth year]   65 y.o.   MRN: [id number] Visit Date: 08/01/2015  Today's Provider: Margaretann Loveless, PA-C   Chief Complaint  Patient presents with  . Anxiety  . Palpitations   Subjective:      Patient was taken off Clonidine and discontinued taking it on Sunday 07/28/2014. Since stopping Clonidine she has had symptoms of palpitations and anxiety. Having symptoms of racing heart, no appetite and trouble sleeping.  Palpitations  This is a new problem. The current episode started in the past 7 days (4 days). The problem occurs constantly. The problem has been unchanged. The symptoms are aggravated by unknown. Associated symptoms include anxiety, dizziness, an irregular heartbeat, malaise/fatigue and nausea. Pertinent negatives include no chest fullness, chest pain, diaphoresis, fever, near-syncope, numbness, shortness of breath, syncope, vomiting or weakness. She has tried breathing exercises for the symptoms. The treatment provided no relief. Her past medical history is significant for anxiety.   No family history of CAD, but both parents have atrial fibrillation.   Allergies  Allergen Reactions  . Codeine Itching   Previous Medications   ATORVASTATIN (LIPITOR) 10 MG TABLET    Take 1 tablet by mouth  daily   CHOLECALCIFEROL (VITAMIN D) 1000 UNITS TABLET    Take 1,000 Units by mouth daily.   COENZYME Q-10 100 MG CAPSULE    Take 100 mg by mouth daily.    HYDROCHLOROTHIAZIDE (HYDRODIURIL) 12.5 MG TABLET    Take 1 tablet (12.5 mg total) by mouth daily.    Review of Systems  Constitutional: Positive for malaise/fatigue and appetite change. Negative for fever, chills, diaphoresis and fatigue.  Respiratory: Negative for chest tightness and shortness of breath.   Cardiovascular: Positive for palpitations. Negative for chest pain, leg swelling, syncope and near-syncope.  Gastrointestinal: Positive for nausea. Negative for vomiting and abdominal  pain.  Neurological: Positive for dizziness and light-headedness. Negative for weakness and numbness.  Psychiatric/Behavioral: The patient is nervous/anxious.     Social History  Substance Use Topics  . Smoking status: Never Smoker   . Smokeless tobacco: Never Used  . Alcohol Use: Yes     Comment: ocasisonally   Objective:   BP 124/80 mmHg  Pulse 111  Temp(Src) 98.1 F (36.7 C) (Oral)  Resp 16  Ht  (1.727 m)  Wt 177 lb (80.287 kg)  BMI 26.92 kg/m2  SpO2 98%  Physical Exam  Constitutional: She appears well-developed and well-nourished. No distress.  HENT:  Head: Normocephalic and atraumatic.  Right Ear: Hearing, tympanic membrane, external ear and ear canal normal.  Left Ear: Hearing, tympanic membrane, external ear and ear canal normal.  Nose: Nose normal.  Mouth/Throat: Uvula is midline, oropharynx is clear and moist and mucous membranes are normal. No oropharyngeal exudate, posterior oropharyngeal edema or posterior oropharyngeal erythema.  Eyes: Conjunctivae are normal. Pupils are equal, round, and reactive to light. Right eye exhibits no discharge. Left eye exhibits no discharge. No scleral icterus.  Neck: Normal range of motion. Neck supple. Carotid bruit is not present. No tracheal deviation present. No thyromegaly present.  Cardiovascular: Normal rate, regular rhythm, normal heart sounds and intact distal pulses.  Exam reveals no gallop and no friction rub.   No murmur heard. Pulmonary/Chest: Effort normal and breath sounds normal. No stridor. No respiratory distress. She has no wheezes. She has no rales.  Lymphadenopathy:    She has no cervical adenopathy.  Skin: Skin is warm  and dry. She is not diaphoretic.  Psychiatric: She has a normal mood and affect. Her behavior is normal. Judgment and thought content normal.  Vitals reviewed.       Assessment & Plan:     1. Short PR-normal QRS complex syndrome EKG was just barely positive for short PR (118) and with  palpitations and tachycardia may be fluctuating into sinus tach arrhythmia. EKG also had diffuse ST depression throughout. She has seen cardiology approx 3 years ago for similar symptoms and did wear a 30-day holter monitor which was normal. She cannot remember which cardiologist she had seen. I will refer her back to cardiology for further evaluation. - Ambulatory referral to Cardiology  2. Palpitations I do feel her symptoms are most closely related to withdrawal from the clonidine. She is going to continue a clonidine at night and HCTZ in the morning to see if symptoms improve. She is to call if symptoms worsen. Will refer to cardiology to make sure she is not going into arrhythmias from the short PR.  - EKG 12-Lead - Ambulatory referral to Cardiology  3. Essential hypertension See above medical treatment plan. - Ambulatory referral to Cardiology  Addend: I called patient today to see if symptoms improved after taking clonidine last night and they have. BP and pulse are stable. Anxiety and palpitations have subsided as well.      Margaretann LovelessJennifer M Basha Krygier, PA-C  [facility]

## 2015-08-01 NOTE — Patient Instructions (Signed)
Short PR Syndrome:  A short A-V conduction time, whether present with normal or with abnormal QRS complex, is associated with an increased incidence of paroxysmal rapid heart action. There are a considerable number of patients who have a short P-R interval, normal QRS complex and bouts of tachycardia. They are usually females, in middle life, devoid of organic heart disease and exhibit a snapping apical first heart sound. They do not demonstrate any of the features of anomalous A-V conduction. Evidence is presented suggesting the operation of endocrine and autonomic nervous system factors in the genesis both of the short P-R interval and the tachycardia.

## 2015-08-01 NOTE — Telephone Encounter (Signed)
Pt is requesting to be seen today due to palpitations. Pt aware of Dr. Santiago BurMaloney's plan. Still would like to be seen because she "is not feeling right". Dr. Santiago BurMaloney's schedule is booked. [facility] 1:45 [facility] Allene DillonEmily Drozdowski, CMA

## 2015-08-01 NOTE — Telephone Encounter (Signed)
Pt called back saying she has been having issues with anxiety, concentration.  Having rapid HR.  Not sleeping , lost of appitite.  She took her blood pressure and it was 157/73  pulse 98.  Please advise  [phone]  Thanks Barth Kirkseri,

## 2015-08-21 ENCOUNTER — Telehealth: Payer: Self-pay | Admitting: Physician Assistant

## 2015-08-21 ENCOUNTER — Other Ambulatory Visit: Payer: Self-pay | Admitting: Physician Assistant

## 2015-08-21 DIAGNOSIS — R002 Palpitations: Secondary | ICD-10-CM

## 2015-08-21 NOTE — Telephone Encounter (Signed)
Patient is requesting labs. Lab order printed.

## 2015-08-22 ENCOUNTER — Other Ambulatory Visit: Payer: Self-pay | Admitting: Physician Assistant

## 2015-08-23 ENCOUNTER — Telehealth: Payer: Self-pay

## 2015-08-23 LAB — CBC WITH DIFFERENTIAL/PLATELET
BASOS: 1 %
Basophils Absolute: 0.1 10*3/uL (ref 0.0–0.2)
EOS (ABSOLUTE): 0.2 10*3/uL (ref 0.0–0.4)
EOS: 4 %
HEMOGLOBIN: 12.8 g/dL (ref 11.1–15.9)
Hematocrit: 37.4 % (ref 34.0–46.6)
IMMATURE GRANS (ABS): 0 10*3/uL (ref 0.0–0.1)
IMMATURE GRANULOCYTES: 0 %
LYMPHS: 35 %
Lymphocytes Absolute: 1.9 10*3/uL (ref 0.7–3.1)
MCH: 28.4 pg (ref 26.6–33.0)
MCHC: 34.2 g/dL (ref 31.5–35.7)
MCV: 83 fL (ref 79–97)
MONOCYTES: 5 %
Monocytes Absolute: 0.3 10*3/uL (ref 0.1–0.9)
NEUTROS ABS: 3 10*3/uL (ref 1.4–7.0)
NEUTROS PCT: 55 %
PLATELETS: 277 10*3/uL (ref 150–379)
RBC: 4.5 x10E6/uL (ref 3.77–5.28)
RDW: 13.1 % (ref 12.3–15.4)
WBC: 5.5 10*3/uL (ref 3.4–10.8)

## 2015-08-23 LAB — COMPREHENSIVE METABOLIC PANEL
A/G RATIO: 1.6 (ref 1.2–2.2)
ALBUMIN: 4.5 g/dL (ref 3.6–4.8)
ALT: 19 IU/L (ref 0–32)
AST: 13 IU/L (ref 0–40)
Alkaline Phosphatase: 85 IU/L (ref 39–117)
BUN / CREAT RATIO: 14 (ref 12–28)
BUN: 9 mg/dL (ref 8–27)
Bilirubin Total: 0.3 mg/dL (ref 0.0–1.2)
CALCIUM: 9.8 mg/dL (ref 8.7–10.3)
CO2: 24 mmol/L (ref 18–29)
Chloride: 98 mmol/L (ref 96–106)
Creatinine, Ser: 0.64 mg/dL (ref 0.57–1.00)
GFR, EST AFRICAN AMERICAN: 109 mL/min/{1.73_m2} (ref >59)
GFR, EST NON AFRICAN AMERICAN: 95 mL/min/{1.73_m2} (ref >59)
Globulin, Total: 2.9 g/dL (ref 1.5–4.5)
Glucose: 83 mg/dL (ref 65–99)
POTASSIUM: 4.4 mmol/L (ref 3.5–5.2)
Sodium: 143 mmol/L (ref 134–144)
TOTAL PROTEIN: 7.4 g/dL (ref 6.0–8.5)

## 2015-08-23 LAB — SPECIMEN STATUS REPORT

## 2015-08-23 LAB — TSH: TSH: 4.5 u[IU]/mL (ref 0.450–4.500)

## 2015-08-23 NOTE — Telephone Encounter (Signed)
Patient advised as directed below.  Thanks,  -Sharmayne Jablon 

## 2015-08-23 NOTE — Telephone Encounter (Signed)
Pt returned call. Thanks TNP °

## 2015-08-23 NOTE — Telephone Encounter (Signed)
-----   Message from Margaretann LovelessJennifer M Burnette, PA-C sent at 08/23/2015  8:53 AM EDT ----- Thyroid is back in normal range. CBC is normal. CMP has not resulted yet so will f/u once with those results once received.

## 2015-08-23 NOTE — Telephone Encounter (Signed)
LMTCB  Thanks,  -Shakala Marlatt 

## 2015-08-23 NOTE — Telephone Encounter (Signed)
-----   Message from Jennifer M Burnette, PA-C sent at 08/23/2015  4:47 PM EDT ----- CMP was completely normal. 

## 2015-08-29 ENCOUNTER — Other Ambulatory Visit: Payer: Self-pay | Admitting: Cardiovascular Disease

## 2015-08-30 MED ORDER — ATORVASTATIN CALCIUM 10 MG PO TABS: ORAL_TABLET | ORAL | Status: DC

## 2015-12-13 ENCOUNTER — Other Ambulatory Visit: Payer: Self-pay

## 2015-12-13 MED ORDER — ATORVASTATIN CALCIUM 10 MG PO TABS: ORAL_TABLET | ORAL | 0 refills | Status: DC

## 2015-12-27 ENCOUNTER — Other Ambulatory Visit: Payer: Self-pay | Admitting: *Deleted

## 2015-12-27 ENCOUNTER — Telehealth: Payer: Self-pay | Admitting: Cardiovascular Disease

## 2015-12-27 NOTE — Telephone Encounter (Signed)
Lmom to call our office for a carotid u/s (2 yr f/u). 

## 2015-12-31 MED ORDER — ATORVASTATIN CALCIUM 10 MG PO TABS: ORAL_TABLET | ORAL | 1 refills | Status: DC

## 2016-01-13 ENCOUNTER — Other Ambulatory Visit: Payer: Self-pay | Admitting: Cardiovascular Disease

## 2016-01-13 DIAGNOSIS — I6523 Occlusion and stenosis of bilateral carotid arteries: Secondary | ICD-10-CM

## 2016-02-10 ENCOUNTER — Encounter: Payer: Self-pay | Admitting: *Deleted

## 2016-05-16 ENCOUNTER — Other Ambulatory Visit: Payer: Self-pay | Admitting: Obstetrics & Gynecology

## 2016-06-21 ENCOUNTER — Other Ambulatory Visit: Payer: Self-pay | Admitting: Physician Assistant

## 2016-07-28 ENCOUNTER — Ambulatory Visit (INDEPENDENT_AMBULATORY_CARE_PROVIDER_SITE_OTHER): Payer: 59 | Admitting: Physician Assistant

## 2016-07-28 ENCOUNTER — Encounter: Payer: Self-pay | Admitting: Physician Assistant

## 2016-07-28 VITALS — BP 120/72 | HR 68 | Temp 98.8°F | Resp 16 | Ht 68.0 in | Wt 178.0 lb

## 2016-07-28 DIAGNOSIS — Z0000 Encounter for general adult medical examination without abnormal findings: Secondary | ICD-10-CM | POA: Diagnosis not present

## 2016-07-28 DIAGNOSIS — Z1159 Encounter for screening for other viral diseases: Secondary | ICD-10-CM | POA: Diagnosis not present

## 2016-07-28 DIAGNOSIS — Z6827 Body mass index (BMI) 27.0-27.9, adult: Secondary | ICD-10-CM | POA: Diagnosis not present

## 2016-07-28 DIAGNOSIS — I10 Essential (primary) hypertension: Secondary | ICD-10-CM

## 2016-07-28 DIAGNOSIS — Z114 Encounter for screening for human immunodeficiency virus [HIV]: Secondary | ICD-10-CM | POA: Diagnosis not present

## 2016-07-28 DIAGNOSIS — D649 Anemia, unspecified: Secondary | ICD-10-CM | POA: Diagnosis not present

## 2016-07-28 DIAGNOSIS — E039 Hypothyroidism, unspecified: Secondary | ICD-10-CM

## 2016-07-28 DIAGNOSIS — Z860100 Personal history of colon polyps, unspecified: Secondary | ICD-10-CM

## 2016-07-28 DIAGNOSIS — Z131 Encounter for screening for diabetes mellitus: Secondary | ICD-10-CM

## 2016-07-28 DIAGNOSIS — E7800 Pure hypercholesterolemia, unspecified: Secondary | ICD-10-CM

## 2016-07-28 DIAGNOSIS — Z23 Encounter for immunization: Secondary | ICD-10-CM

## 2016-07-28 DIAGNOSIS — E038 Other specified hypothyroidism: Secondary | ICD-10-CM

## 2016-07-28 NOTE — Patient Instructions (Signed)
Health Maintenance for Postmenopausal Women Menopause is a normal process in which your reproductive ability comes to an end. This process happens gradually over a span of months to years, usually between the ages of 33 and 38. Menopause is complete when you have missed 12 consecutive menstrual periods. It is important to talk with your health care provider about some of the most common conditions that affect postmenopausal women, such as heart disease, cancer, and bone loss (osteoporosis). Adopting a healthy lifestyle and getting preventive care can help to promote your health and wellness. Those actions can also lower your chances of developing some of these common conditions. What should I know about menopause? During menopause, you may experience a number of symptoms, such as:  Moderate-to-severe hot flashes.  Night sweats.  Decrease in sex drive.  Mood swings.  Headaches.  Tiredness.  Irritability.  Memory problems.  Insomnia. Choosing to treat or not to treat menopausal changes is an individual decision that you make with your health care provider. What should I know about hormone replacement therapy and supplements? Hormone therapy products are effective for treating symptoms that are associated with menopause, such as hot flashes and night sweats. Hormone replacement carries certain risks, especially as you become older. If you are thinking about using estrogen or estrogen with progestin treatments, discuss the benefits and risks with your health care provider. What should I know about heart disease and stroke? Heart disease, heart attack, and stroke become more likely as you age. This may be due, in part, to the hormonal changes that your body experiences during menopause. These can affect how your body processes dietary fats, triglycerides, and cholesterol. Heart attack and stroke are both medical emergencies. There are many things that you can do to help prevent heart disease  and stroke:  Have your blood pressure checked at least every 1-2 years. High blood pressure causes heart disease and increases the risk of stroke.  If you are 48-61 years old, ask your health care provider if you should take aspirin to prevent a heart attack or a stroke.  Do not use any tobacco products, including cigarettes, chewing tobacco, or electronic cigarettes. If you need help quitting, ask your health care provider.  It is important to eat a healthy diet and maintain a healthy weight.  Be sure to include plenty of vegetables, fruits, low-fat dairy products, and lean protein.  Avoid eating foods that are high in solid fats, added sugars, or salt (sodium).  Get regular exercise. This is one of the most important things that you can do for your health.  Try to exercise for at least 150 minutes each week. The type of exercise that you do should increase your heart rate and make you sweat. This is known as moderate-intensity exercise.  Try to do strengthening exercises at least twice each week. Do these in addition to the moderate-intensity exercise.  Know your numbers.Ask your health care provider to check your cholesterol and your blood glucose. Continue to have your blood tested as directed by your health care provider. What should I know about cancer screening? There are several types of cancer. Take the following steps to reduce your risk and to catch any cancer development as early as possible. Breast Cancer  Practice breast self-awareness.  This means understanding how your breasts normally appear and feel.  It also means doing regular breast self-exams. Let your health care provider know about any changes, no matter how small.  If you are 40 or older,  have a clinician do a breast exam (clinical breast exam or CBE) every year. Depending on your age, family history, and medical history, it may be recommended that you also have a yearly breast X-ray (mammogram).  If you  have a family history of breast cancer, talk with your health care provider about genetic screening.  If you are at high risk for breast cancer, talk with your health care provider about having an MRI and a mammogram every year.  Breast cancer (BRCA) gene test is recommended for women who have family members with BRCA-related cancers. Results of the assessment will determine the need for genetic counseling and BRCA1 and for BRCA2 testing. BRCA-related cancers include these types:  Breast. This occurs in males or females.  Ovarian.  Tubal. This may also be called fallopian tube cancer.  Cancer of the abdominal or pelvic lining (peritoneal cancer).  Prostate.  Pancreatic. Cervical, Uterine, and Ovarian Cancer  Your health care provider may recommend that you be screened regularly for cancer of the pelvic organs. These include your ovaries, uterus, and vagina. This screening involves a pelvic exam, which includes checking for microscopic changes to the surface of your cervix (Pap test).  For women ages 21-65, health care providers may recommend a pelvic exam and a Pap test every three years. For women ages 23-65, they may recommend the Pap test and pelvic exam, combined with testing for human papilloma virus (HPV), every five years. Some types of HPV increase your risk of cervical cancer. Testing for HPV may also be done on women of any age who have unclear Pap test results.  Other health care providers may not recommend any screening for nonpregnant women who are considered low risk for pelvic cancer and have no symptoms. Ask your health care provider if a screening pelvic exam is right for you.  If you have had past treatment for cervical cancer or a condition that could lead to cancer, you need Pap tests and screening for cancer for at least 20 years after your treatment. If Pap tests have been discontinued for you, your risk factors (such as having a new sexual partner) need to be reassessed  to determine if you should start having screenings again. Some women have medical problems that increase the chance of getting cervical cancer. In these cases, your health care provider may recommend that you have screening and Pap tests more often.  If you have a family history of uterine cancer or ovarian cancer, talk with your health care provider about genetic screening.  If you have vaginal bleeding after reaching menopause, tell your health care provider.  There are currently no reliable tests available to screen for ovarian cancer. Lung Cancer  Lung cancer screening is recommended for adults 99-83 years old who are at high risk for lung cancer because of a history of smoking. A yearly low-dose CT scan of the lungs is recommended if you:  Currently smoke.  Have a history of at least 30 pack-years of smoking and you currently smoke or have quit within the past 15 years. A pack-year is smoking an average of one pack of cigarettes per day for one year. Yearly screening should:  Continue until it has been 15 years since you quit.  Stop if you develop a health problem that would prevent you from having lung cancer treatment. Colorectal Cancer  This type of cancer can be detected and can often be prevented.  Routine colorectal cancer screening usually begins at age 72 and continues  through age 75.  If you have risk factors for colon cancer, your health care provider may recommend that you be screened at an earlier age.  If you have a family history of colorectal cancer, talk with your health care provider about genetic screening.  Your health care provider may also recommend using home test kits to check for hidden blood in your stool.  A small camera at the end of a tube can be used to examine your colon directly (sigmoidoscopy or colonoscopy). This is done to check for the earliest forms of colorectal cancer.  Direct examination of the colon should be repeated every 5-10 years until  age 75. However, if early forms of precancerous polyps or small growths are found or if you have a family history or genetic risk for colorectal cancer, you may need to be screened more often. Skin Cancer  Check your skin from head to toe regularly.  Monitor any moles. Be sure to tell your health care provider:  About any new moles or changes in moles, especially if there is a change in a mole's shape or color.  If you have a mole that is larger than the size of a pencil eraser.  If any of your family members has a history of skin cancer, especially at a young age, talk with your health care provider about genetic screening.  Always use sunscreen. Apply sunscreen liberally and repeatedly throughout the day.  Whenever you are outside, protect yourself by wearing long sleeves, pants, a wide-brimmed hat, and sunglasses. What should I know about osteoporosis? Osteoporosis is a condition in which bone destruction happens more quickly than new bone creation. After menopause, you may be at an increased risk for osteoporosis. To help prevent osteoporosis or the bone fractures that can happen because of osteoporosis, the following is recommended:  If you are 19-50 years old, get at least 1,000 mg of calcium and at least 600 mg of vitamin D per day.  If you are older than age 50 but younger than age 70, get at least 1,200 mg of calcium and at least 600 mg of vitamin D per day.  If you are older than age 70, get at least 1,200 mg of calcium and at least 800 mg of vitamin D per day. Smoking and excessive alcohol intake increase the risk of osteoporosis. Eat foods that are rich in calcium and vitamin D, and do weight-bearing exercises several times each week as directed by your health care provider. What should I know about how menopause affects my mental health? Depression may occur at any age, but it is more common as you become older. Common symptoms of depression include:  Low or sad  mood.  Changes in sleep patterns.  Changes in appetite or eating patterns.  Feeling an overall lack of motivation or enjoyment of activities that you previously enjoyed.  Frequent crying spells. Talk with your health care provider if you think that you are experiencing depression. What should I know about immunizations? It is important that you get and maintain your immunizations. These include:  Tetanus, diphtheria, and pertussis (Tdap) booster vaccine.  Influenza every year before the flu season begins.  Pneumonia vaccine.  Shingles vaccine. Your health care provider may also recommend other immunizations. This information is not intended to replace advice given to you by your health care provider. Make sure you discuss any questions you have with your health care provider. Document Released: 04/10/2005 Document Revised: 09/06/2015 Document Reviewed: 11/20/2014 Elsevier Interactive Patient   Education  2017 [employer]  

## 2016-07-28 NOTE — Progress Notes (Signed)
Patient: Maria Carrillo, Female    DOB: 08/19/1950, 66 y.o.   MRN: [id number] Visit Date: 07/28/2016  Today's Provider: Margaretann Loveless, PA-C   Chief Complaint  Patient presents with  . Annual Exam   Subjective:    Annual physical exam Maria Carrillo is a 66 y.o. female who presents today for health maintenance and complete physical. She feels well. She reports exercising none, but walks and play golf. She reports she is sleeping well.  Last CPE:07/24/15 Mammogram:07/26/15-BI-RADS 1 Colonoscopy:07/29/12-Diverticulosis, internal hemorrhoids.Recheck in 5 years Tdap:2008 Pap: Gyn-She goes to Dr.Harris for breast exam, mammogram orders and pap. -----------------------------------------------------------------   Review of Systems  Constitutional: Negative.   HENT: Negative.   Eyes: Negative.   Respiratory: Negative.   Cardiovascular: Negative.   Gastrointestinal: Negative.   Endocrine: Negative.   Genitourinary: Negative.   Musculoskeletal: Negative.   Skin: Negative.   Allergic/Immunologic: Negative.   Neurological: Negative.   Hematological: Negative.   Psychiatric/Behavioral: Negative.     Social History      She  reports that she has never smoked. She has never used smokeless tobacco. She reports that she does not use drugs.       Social History   Social History  . Marital status: Married    Spouse name: N/A  . Number of children: N/A  . Years of education: N/A   Social History Main Topics  . Smoking status: Never Smoker  . Smokeless tobacco: Never Used  . Alcohol use None     Comment: ocasisonally  . Drug use: No  . Sexual activity: Not Asked   Other Topics Concern  . None   Social History Narrative  . None    Past Medical History:  Diagnosis Date  . Allergic rhinitis, cause unspecified   . Anxiety state, unspecified   . Asthma   . Benign paroxysmal positional vertigo   . Calculus of kidney   . Cystocele, midline   . Esophageal  reflux   . Hx of colonic polyps   . Hypertension   . Osteoarthrosis, unspecified whether generalized or localized, hand   . Osteoarthrosis, unspecified whether generalized or localized, unspecified site   . Pure hypercholesterolemia   . Rosacea   . Stroke (HCC)   . Syncope and collapse      Patient Active Problem List   Diagnosis Date Noted  . Anxiety 08/30/2014  . Acid reflux 08/30/2014  . BP (high blood pressure) 08/30/2014  . Arthritis of hand, degenerative 08/30/2014  . Arthritis, degenerative 08/30/2014  . Subclinical hypothyroidism 08/30/2014  . Rosacea 08/30/2014  . Paresthesia 08/30/2014  . Anemia 08/30/2014  . Benign paroxysmal positional nystagmus 02/04/2009  . Asthma, exogenous 11/15/2008  . Allergic rhinitis 10/10/2008  . History of colon polyps 10/10/2008  . Hypercholesteremia 10/10/2008    Past Surgical History:  Procedure Laterality Date  . BUNIONECTOMY    . CARDIAC CATHETERIZATION     [facility]  . KNEE SURGERY    . KNEE SURGERY    . PARTIAL HYSTERECTOMY    . SHOULDER SURGERY      Family History        Family Status  Relation Status  . Mother Alive  . Father Deceased  . Sister Alive  . Brother Alive  . Mat Aunt (Not Specified)        Her family history includes Arrhythmia in her father and mother; Breast cancer in her maternal aunt; Heart murmur in her mother.  Allergies  Allergen Reactions  . Codeine Itching     Current Outpatient Prescriptions:  .  atorvastatin (LIPITOR) 10 MG tablet, TAKE 1 TABLET BY MOUTH  DAILY, Disp: 90 tablet, Rfl: 1 .  cholecalciferol (VITAMIN D) 1000 UNITS tablet, Take 1,000 Units by mouth daily., Disp: , Rfl:  .  cloNIDine (CATAPRES) 0.2 MG tablet, TAKE 1 TABLET BY MOUTH ONCE DAILY, Disp: 90 tablet, Rfl: 0 .  Coenzyme Q-10 100 MG capsule, Take 100 mg by mouth daily. , Disp: , Rfl:  .  hydrochlorothiazide (HYDRODIURIL) 12.5 MG tablet, Take 1 tablet (12.5 mg total) by mouth daily., Disp: 90 tablet, Rfl: 3   Patient  Care Team: Margaretann Loveless, PA-C as PCP - General (Family Medicine)      Objective:   Vitals: BP 120/72 (BP Location: Left Arm, Patient Position: Sitting, Cuff Size: Normal)   Pulse 68   Temp 98.8 F (37.1 C) (Oral)   Resp 16   Ht 5\' 8"  (1.727 m)   Wt 178 lb (80.7 kg)   BMI 27.06 kg/m    Vitals:   07/28/16 1028  BP: 120/72  Pulse: 68  Resp: 16  Temp: 98.8 F (37.1 C)  TempSrc: Oral  Weight: 178 lb (80.7 kg)  Height: 5\' 8"  (1.727 m)     Physical Exam  Constitutional: She is oriented to person, place, and time. She appears well-developed and well-nourished. No distress.  HENT:  Head: Normocephalic and atraumatic.  Right Ear: Hearing, tympanic membrane, external ear and ear canal normal.  Left Ear: Hearing, tympanic membrane, external ear and ear canal normal.  Nose: Nose normal.  Mouth/Throat: Uvula is midline, oropharynx is clear and moist and mucous membranes are normal. No oropharyngeal exudate.  Eyes: Conjunctivae and EOM are normal. Pupils are equal, round, and reactive to light. Right eye exhibits no discharge. Left eye exhibits no discharge. No scleral icterus.  Neck: Normal range of motion. Neck supple. No JVD present. Carotid bruit is not present. No tracheal deviation present. No thyromegaly present.  Cardiovascular: Normal rate, regular rhythm, normal heart sounds and intact distal pulses.  Exam reveals no gallop and no friction rub.   No murmur heard. Pulmonary/Chest: Effort normal and breath sounds normal. No respiratory distress. She has no wheezes. She has no rales. She exhibits no tenderness.  Abdominal: Soft. Bowel sounds are normal. She exhibits no distension and no mass. There is no tenderness. There is no rebound and no guarding.  Musculoskeletal: Normal range of motion. She exhibits no edema or tenderness.  Lymphadenopathy:    She has no cervical adenopathy.  Neurological: She is alert and oriented to person, place, and time.  Skin: Skin is warm  and dry. No rash noted. She is not diaphoretic.  Psychiatric: She has a normal mood and affect. Her behavior is normal. Judgment and thought content normal.  Vitals reviewed.    Depression Screen PHQ 2/9 Scores 07/28/2016 07/24/2015  PHQ - 2 Score 0 0  PHQ- 9 Score 0 -      Assessment & Plan:     Routine Health Maintenance and Physical Exam  Exercise Activities and Dietary recommendations Goals    None      Immunization History  Administered Date(s) Administered  . Influenza Split 02/08/2012, 01/14/2013  . Pneumococcal Conjugate-13 07/28/2016  . Td 07/28/2016  . Tdap 03/02/2006    Health Maintenance  Topic Date Due  . Hepatitis C Screening  18-May-1950  . HIV Screening  06/29/1966  . PAP SMEAR  06/28/1972  . TETANUS/TDAP  03/02/2016  . PNA vac Low Risk Adult (1 of 2 - PCV13) 06/28/2016  . MAMMOGRAM  07/25/2016  . DEXA SCAN  07/28/2017 (Originally 06/28/2016)  . INFLUENZA VACCINE  09/30/2016  . COLONOSCOPY  07/29/2017     Discussed health benefits of physical activity, and encouraged her to engage in regular exercise appropriate for her age and condition.    1. Annual physical exam Normal physical exam today for age. Patient declines bone density this year, states she will wait until next year. Dr. Tiburcio PeaHarris follows pap, breast exams and mammograms. She has an appt next week.  2. Essential hypertension Stable. Continue clonidine 0.2mg  and HCTZ 12.5mg . Will check labs as below and f/u pending results. - CBC with Differential/Platelet - Comprehensive metabolic panel  3. Subclinical hypothyroidism No symptoms. Will check labs as below and f/u pending results. - TSH  4. Hypercholesteremia Continue atorvastatin 10mg . Will check labs as below and f/u pending results. - CBC with Differential/Platelet - Comprehensive metabolic panel - Lipid panel  5. History of colon polyps Colonoscopy due next year.  6. Anemia, unspecified type Will check labs as below and f/u  pending results. - CBC with Differential/Platelet  7. Need for vaccination with 13-polyvalent pneumococcal conjugate vaccine Prevnar 13 Vaccine given to patient without complications. Patient sat for 15 minutes after administration and was tolerated well without adverse effects. - Pneumococcal conjugate vaccine 13-valent  8. Need for tetanus booster Td Vaccine given to patient without complications. Patient sat for 15 minutes after administration and was tolerated well without adverse effects. - Td : Tetanus/diphtheria >7yo Preservative  free  9. Screening for HIV without presence of risk factors - HIV antibody  10. Diabetes mellitus screening - Hemoglobin A1c  11. Need for hepatitis C screening test - Hepatitis C antibody  12. BMI 27.0-27.9,adult Counseled patient on healthy lifestyle modifications including dieting and exercise.   --------------------------------------------------------------------    Margaretann LovelessJennifer M Samanthajo Payano, PA-C  [facility]

## 2016-08-05 ENCOUNTER — Other Ambulatory Visit: Payer: Self-pay | Admitting: Obstetrics & Gynecology

## 2016-08-24 ENCOUNTER — Ambulatory Visit (INDEPENDENT_AMBULATORY_CARE_PROVIDER_SITE_OTHER): Payer: 59 | Admitting: Obstetrics & Gynecology

## 2016-08-24 ENCOUNTER — Encounter: Payer: Self-pay | Admitting: Obstetrics & Gynecology

## 2016-08-24 VITALS — BP 120/80 | HR 60 | Ht 68.0 in | Wt 176.0 lb

## 2016-08-24 DIAGNOSIS — Z124 Encounter for screening for malignant neoplasm of cervix: Secondary | ICD-10-CM | POA: Diagnosis not present

## 2016-08-24 DIAGNOSIS — Z1231 Encounter for screening mammogram for malignant neoplasm of breast: Secondary | ICD-10-CM | POA: Diagnosis not present

## 2016-08-24 DIAGNOSIS — Z1211 Encounter for screening for malignant neoplasm of colon: Secondary | ICD-10-CM

## 2016-08-24 DIAGNOSIS — R232 Flushing: Secondary | ICD-10-CM

## 2016-08-24 DIAGNOSIS — Z0000 Encounter for general adult medical examination without abnormal findings: Secondary | ICD-10-CM

## 2016-08-24 DIAGNOSIS — Z01419 Encounter for gynecological examination (general) (routine) without abnormal findings: Secondary | ICD-10-CM

## 2016-08-24 DIAGNOSIS — Z1239 Encounter for other screening for malignant neoplasm of breast: Secondary | ICD-10-CM

## 2016-08-24 MED ORDER — CLONIDINE HCL 0.2 MG PO TABS: 0.2000 mg | ORAL_TABLET | Freq: Every day | ORAL | 3 refills | Status: AC

## 2016-08-24 NOTE — Progress Notes (Signed)
HPI:      [name] is a 66 y.o. G2P2 who LMP was in the past, she presents today for her annual examination.  The patient has no complaints today. The patient is sexually active. Herlast pap: approximate date 2013 and was normal and last mammogram: approximate date 2017 and was normal.  The patient does perform self breast exams.  There is no notable family history of breast or ovarian cancer in her family. The patient is not taking hormone replacement therapy. Patient denies post-menopausal vaginal bleeding.   The patient has regular exercise: yes. The patient denies current symptoms of depression.    GYN Hx: Last Colonoscopy:3 years ago. Normal.  Last DEXA: never ago.    PMHx: Past Medical History:  Diagnosis Date  . Allergic rhinitis, cause unspecified   . Anxiety state, unspecified   . Asthma   . Benign paroxysmal positional vertigo   . Calculus of kidney   . Cystocele, midline   . Esophageal reflux   . Hx of colonic polyps   . Hypertension   . Osteoarthrosis, unspecified whether generalized or localized, hand   . Osteoarthrosis, unspecified whether generalized or localized, unspecified site   . Pure hypercholesterolemia   . Rosacea   . Stroke (HCC)   . Syncope and collapse    Past Surgical History:  Procedure Laterality Date  . BUNIONECTOMY    . CARDIAC CATHETERIZATION     [facility]  . KNEE SURGERY    . KNEE SURGERY    . PARTIAL HYSTERECTOMY    . SHOULDER SURGERY     Family History  Problem Relation Age of Onset  . Arrhythmia Mother   . Heart murmur Mother   . Arrhythmia Father   . Breast cancer Maternal Aunt    Social History  Substance Use Topics  . Smoking status: Never Smoker  . Smokeless tobacco: Never Used  . Alcohol use 0.6 - 1.2 oz/week    1 - 2 Glasses of wine per week     Comment: ocasisonally    Current Outpatient Prescriptions:  .  atorvastatin (LIPITOR) 10 MG tablet, TAKE 1 TABLET BY MOUTH  DAILY, Disp: 90 tablet, Rfl: 1 .  cholecalciferol  (VITAMIN D) 1000 UNITS tablet, Take 1,000 Units by mouth daily., Disp: , Rfl:  .  cloNIDine (CATAPRES) 0.2 MG tablet, Take 1 tablet (0.2 mg total) by mouth daily., Disp: 90 tablet, Rfl: 3 .  Coenzyme Q-10 100 MG capsule, Take 100 mg by mouth daily. , Disp: , Rfl:  .  hydrochlorothiazide (HYDRODIURIL) 12.5 MG tablet, Take 1 tablet (12.5 mg total) by mouth daily., Disp: 90 tablet, Rfl: 3 Allergies: Codeine  Review of Systems  Constitutional: Negative for chills, fever and malaise/fatigue.  HENT: Negative for congestion, sinus pain and sore throat.   Eyes: Negative for blurred vision and pain.  Respiratory: Negative for cough and wheezing.   Cardiovascular: Negative for chest pain and leg swelling.  Gastrointestinal: Negative for abdominal pain, constipation, diarrhea, heartburn, nausea and vomiting.  Genitourinary: Negative for dysuria, frequency, hematuria and urgency.  Musculoskeletal: Negative for back pain, joint pain, myalgias and neck pain.  Skin: Negative for itching and rash.  Neurological: Negative for dizziness, tremors and weakness.  Endo/Heme/Allergies: Does not bruise/bleed easily.  Psychiatric/Behavioral: Negative for depression. The patient is not nervous/anxious and does not have insomnia.     Objective: BP 120/80   Pulse 60   Ht 5\' 8"  (1.727 m)   Wt 176 lb (79.8 kg)  BMI 26.76 kg/m   Filed Weights   08/24/16 1322  Weight: 176 lb (79.8 kg)   Body mass index is 26.76 kg/m. Physical Exam  Constitutional: She is oriented to person, place, and time. She appears well-developed and well-nourished. No distress.  Genitourinary: Rectum normal and vagina normal. Pelvic exam was performed with patient supine. There is no rash or lesion on the right labia. There is no rash or lesion on the left labia. Vagina exhibits no lesion. No bleeding in the vagina. Right adnexum does not display mass and does not display tenderness. Left adnexum does not display mass and does not display  tenderness.  Genitourinary Comments: Absent Uterus Absent cervix Vaginal cuff well healed  HENT:  Head: Normocephalic and atraumatic. Head is without laceration.  Right Ear: Hearing normal.  Left Ear: Hearing normal.  Nose: No epistaxis.  No foreign bodies.  Mouth/Throat: Uvula is midline, oropharynx is clear and moist and mucous membranes are normal.  Eyes: Pupils are equal, round, and reactive to light.  Neck: Normal range of motion. Neck supple. No thyromegaly present.  Cardiovascular: Normal rate and regular rhythm.  Exam reveals no gallop and no friction rub.   No murmur heard. Pulmonary/Chest: Effort normal and breath sounds normal. No respiratory distress. She has no wheezes. Right breast exhibits no mass, no skin change and no tenderness. Left breast exhibits no mass, no skin change and no tenderness.  Abdominal: Soft. Bowel sounds are normal. She exhibits no distension. There is no tenderness. There is no rebound.  Musculoskeletal: Normal range of motion.  Neurological: She is alert and oriented to person, place, and time. No cranial nerve deficit.  Skin: Skin is warm and dry.  Psychiatric: She has a normal mood and affect. Judgment normal.  Vitals reviewed.   Assessment: Annual Exam 1. Annual physical exam   2. Screening for breast cancer   3. Screen for colon cancer   4. Screening for cervical cancer   5. Hot flashes     Plan:            1.  Cervical Screening-  Pap smear done today, Pap smear schedule reviewed with patient  2. Breast screening- Exam annually and mammogram scheduled  3. Colonoscopy every 10 years, Hemoccult testing after age 65  4. Labs managed by PCP  5. Counseling for hormonal therapy: none Cont Clonidine pills  6. Plans breast reduction surgery Sept due to back and shoulder pains     F/U  Return in about 1 year (around 08/24/2017) for Annual.  Maria MajorPaul Clancy Leiner, MD, Merlinda FrederickFACOG [facility], [facility] 08/24/2016  1:44 PM

## 2016-08-24 NOTE — Patient Instructions (Signed)
PAP every 5 years Mammogram every year Colonoscopy every 5 years Labs yearly (with PCP)

## 2016-08-26 ENCOUNTER — Ambulatory Visit
Admission: RE | Admit: 2016-08-26 | Discharge: 2016-08-26 | Disposition: A | Discharge status: Home / Self Care | Payer: 59 | Source: Ambulatory Visit | Attending: Obstetrics & Gynecology | Admitting: Obstetrics & Gynecology

## 2016-08-26 ENCOUNTER — Other Ambulatory Visit: Payer: Self-pay | Admitting: Obstetrics & Gynecology

## 2016-08-26 DIAGNOSIS — R928 Other abnormal and inconclusive findings on diagnostic imaging of breast: Secondary | ICD-10-CM

## 2016-08-26 DIAGNOSIS — Z1231 Encounter for screening mammogram for malignant neoplasm of breast: Secondary | ICD-10-CM | POA: Diagnosis not present

## 2016-08-26 DIAGNOSIS — Z1239 Encounter for other screening for malignant neoplasm of breast: Secondary | ICD-10-CM

## 2016-08-26 LAB — PAP IG (IMAGE GUIDED): PAP SMEAR COMMENT: 0

## 2016-08-26 NOTE — Progress Notes (Signed)
MMG reviewed and orders needed for diagnostic. Let patient know left breast follow up mammogram and ultrasound has been ordered, and that they should be contacting to schedule.  Often, follow up imaging is negative, so try not to worry until we can get these additional views.

## 2016-08-27 LAB — FECAL OCCULT BLOOD, IMMUNOCHEMICAL: FECAL OCCULT BLD: NEGATIVE

## 2016-08-31 ENCOUNTER — Ambulatory Visit
Admission: RE | Admit: 2016-08-31 | Discharge: 2016-08-31 | Disposition: A | Discharge status: Home / Self Care | Payer: 59 | Source: Ambulatory Visit | Attending: Obstetrics & Gynecology | Admitting: Obstetrics & Gynecology

## 2016-08-31 DIAGNOSIS — N6322 Unspecified lump in the left breast, upper inner quadrant: Secondary | ICD-10-CM | POA: Diagnosis not present

## 2016-08-31 DIAGNOSIS — R928 Other abnormal and inconclusive findings on diagnostic imaging of breast: Secondary | ICD-10-CM

## 2016-09-01 ENCOUNTER — Encounter: Payer: Self-pay | Admitting: Obstetrics & Gynecology

## 2016-09-01 NOTE — Telephone Encounter (Signed)
Any way to get records from mammograms (and images?) to Dr Etter Sjogrenavid [place]?

## 2016-09-14 ENCOUNTER — Other Ambulatory Visit: Payer: Self-pay | Admitting: Obstetrics & Gynecology

## 2016-09-14 DIAGNOSIS — R928 Other abnormal and inconclusive findings on diagnostic imaging of breast: Secondary | ICD-10-CM

## 2016-09-14 DIAGNOSIS — N6320 Unspecified lump in the left breast, unspecified quadrant: Secondary | ICD-10-CM

## 2016-09-18 ENCOUNTER — Ambulatory Visit
Admission: RE | Admit: 2016-09-18 | Discharge: 2016-09-18 | Disposition: A | Discharge status: Home / Self Care | Payer: 59 | Source: Ambulatory Visit | Attending: Obstetrics & Gynecology | Admitting: Obstetrics & Gynecology

## 2016-09-18 DIAGNOSIS — N6082 Other benign mammary dysplasias of left breast: Secondary | ICD-10-CM | POA: Diagnosis not present

## 2016-09-18 DIAGNOSIS — N6320 Unspecified lump in the left breast, unspecified quadrant: Secondary | ICD-10-CM

## 2016-09-18 DIAGNOSIS — N6322 Unspecified lump in the left breast, upper inner quadrant: Secondary | ICD-10-CM | POA: Insufficient documentation

## 2016-09-18 DIAGNOSIS — R928 Other abnormal and inconclusive findings on diagnostic imaging of breast: Secondary | ICD-10-CM

## 2016-09-21 LAB — SURGICAL PATHOLOGY

## 2016-09-22 ENCOUNTER — Encounter: Payer: Self-pay | Admitting: Obstetrics & Gynecology

## 2016-11-11 ENCOUNTER — Other Ambulatory Visit
Admission: RE | Admit: 2016-11-11 | Discharge: 2016-11-11 | Disposition: A | Discharge status: Home / Self Care | Payer: 59 | Source: Ambulatory Visit | Attending: Nurse Practitioner | Admitting: Nurse Practitioner

## 2016-11-11 DIAGNOSIS — R197 Diarrhea, unspecified: Secondary | ICD-10-CM | POA: Insufficient documentation

## 2016-11-11 LAB — C DIFFICILE QUICK SCREEN W PCR REFLEX
C Diff antigen: NEGATIVE
C Diff interpretation: NOT DETECTED
C Diff toxin: NEGATIVE

## 2016-11-11 LAB — GASTROINTESTINAL PANEL BY PCR, STOOL (REPLACES STOOL CULTURE)

## 2016-12-04 ENCOUNTER — Other Ambulatory Visit: Payer: Self-pay | Admitting: Physician Assistant

## 2016-12-04 DIAGNOSIS — E7800 Pure hypercholesterolemia, unspecified: Secondary | ICD-10-CM

## 2019-02-07 IMAGING — US US BREAST*L* LIMITED INC AXILLA
1 series · 5 of 5 positions shown · non-contrast
Comparison: Previous exam(s).

CLINICAL DATA: Callback from screening mammogram for possible left
breast mass. The patient reports that she is planning to have
bilateral breast reduction surgery in October 2016.

EXAM:
2D DIGITAL DIAGNOSTIC LEFT MAMMOGRAM WITH CAD AND ADJUNCT TOMO
ULTRASOUND LEFT BREAST

[Series 1: us breast*left* limited inc axilla · 0.05mm/px · 5 of 5 slices shown]
[im 1/5]
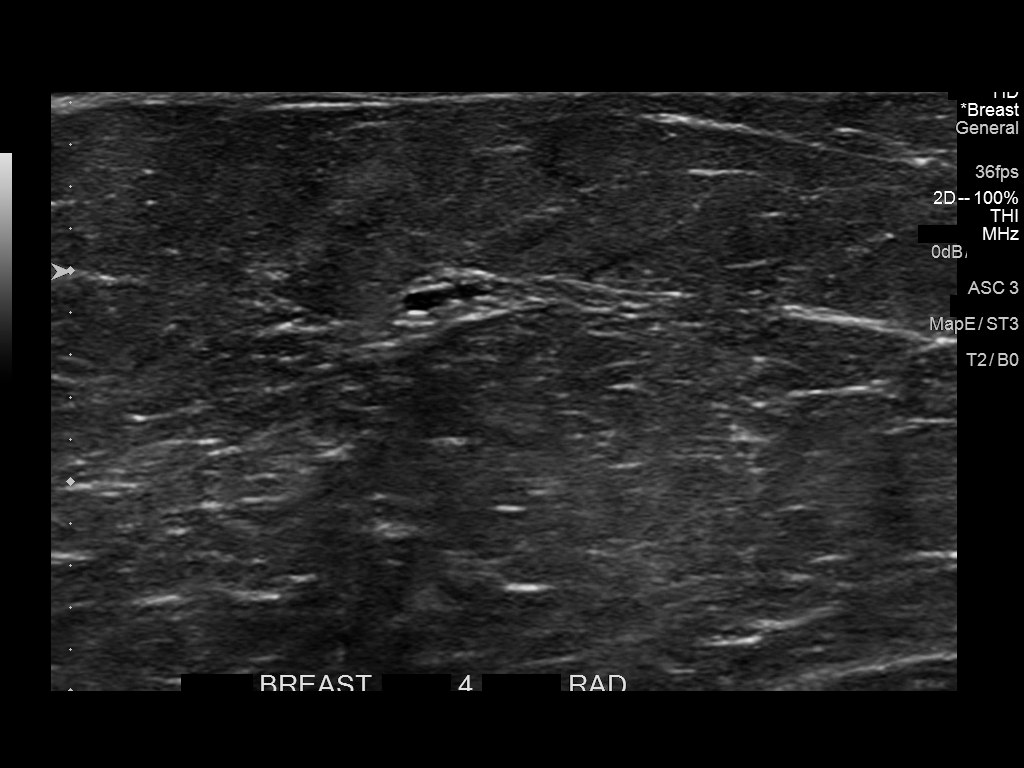
[im 2/5]
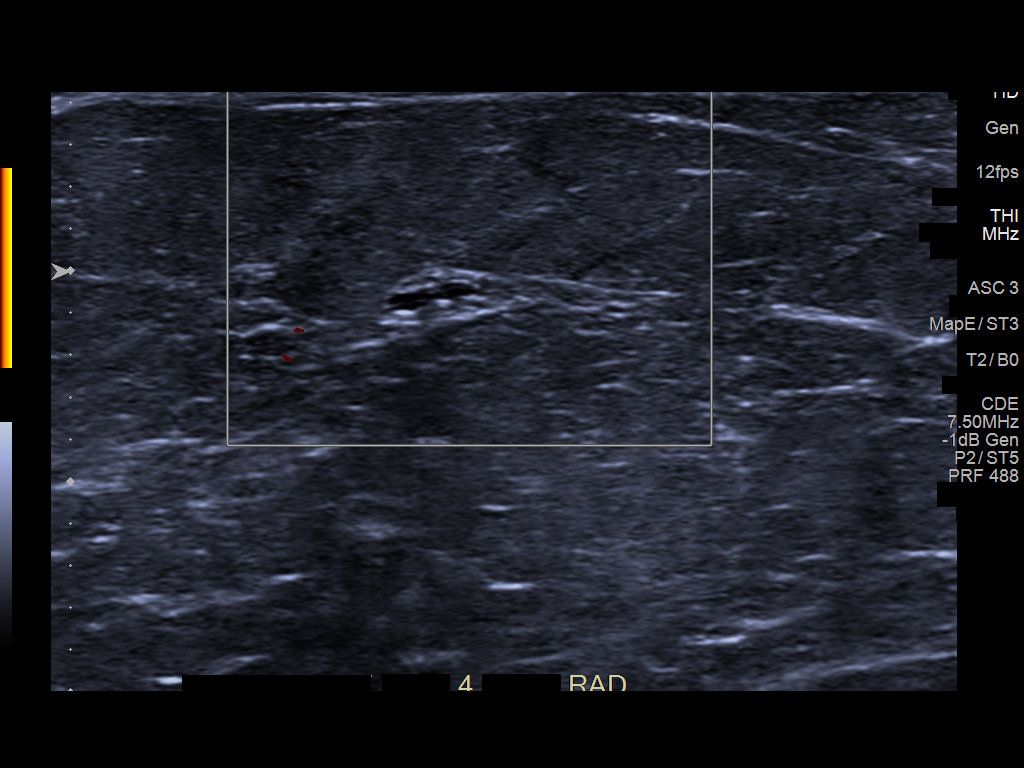
[im 3/5]
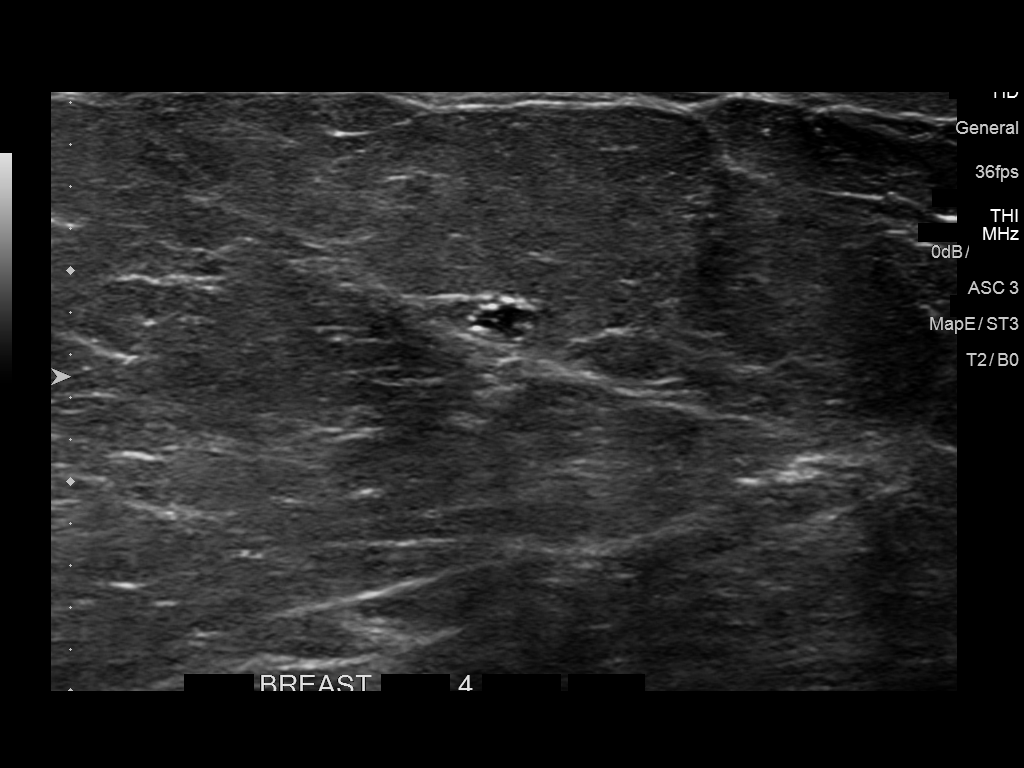
[im 4/5]
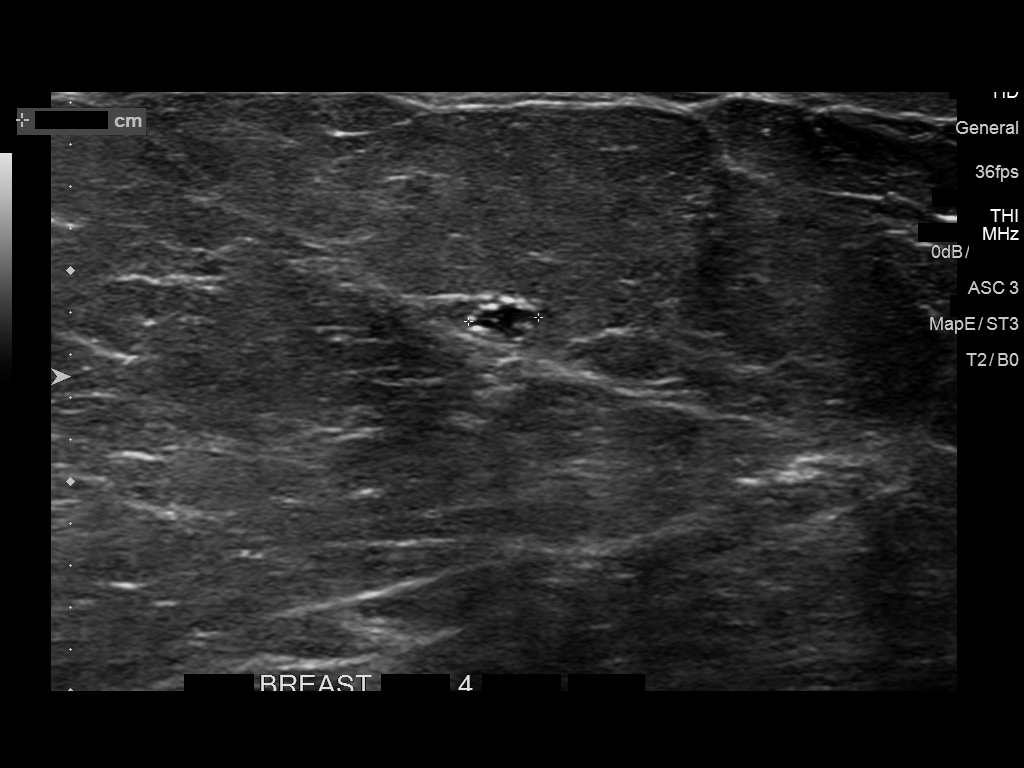
[im 5/5]
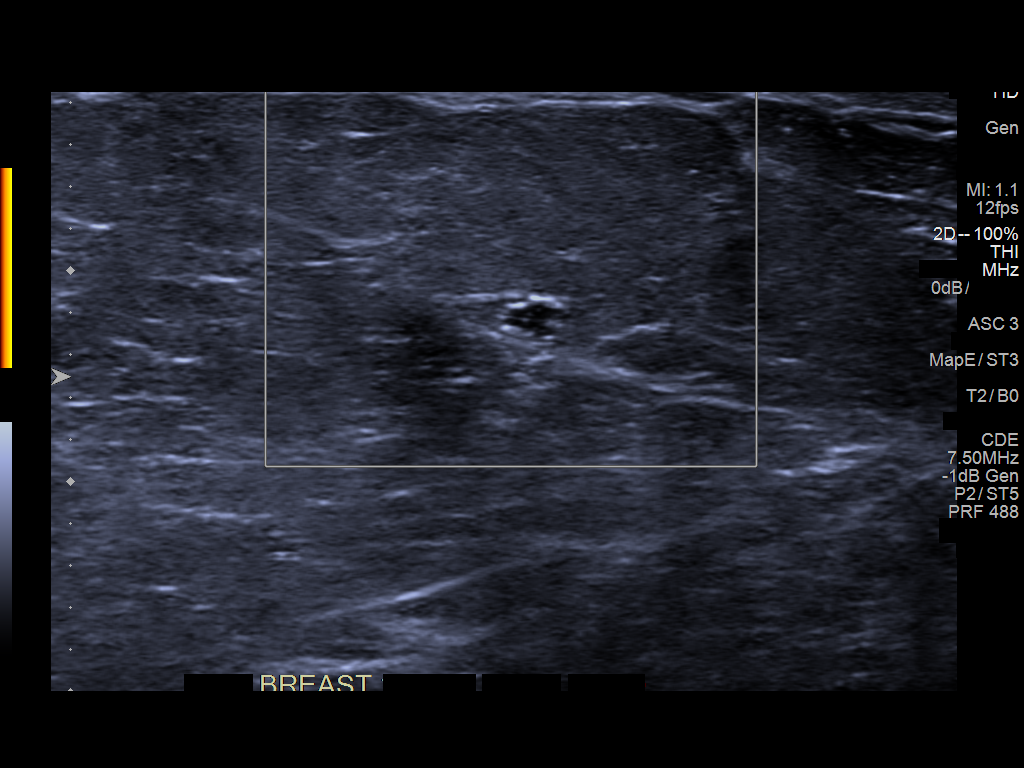

[5 of 5 positions shown; findings below may reference images not displayed]

ACR Breast Density Category b: There are scattered areas of
fibroglandular density.
FINDINGS: MLO, full lateral, and CC and MLO spot-compression views of the left
breast were performed with tomosynthesis. On the additional views,
there is a 6 mm oval, low-density, circumscribed mass in the
superior left breast, anterior depth. It is uncertain whether this
mass represents the same finding seen on the MLO view of the
screening mammogram as this mass appears to be within the slightly
medial left breast on today's tomosynthesis images as opposed to the
slightly lateral left breast on the screening mammogram. The
possible abnormality seen on the MLO view of the screening mammogram
is not definitely reproduced on today's repeat MLO view. No definite
mass is seen in the area of concern within the lateral left breast
on the CC view.

Mammographic images were processed with CAD.

On physical exam, no discrete mass is felt in the area of concern
within the superior left breast.

Targeted ultrasound of the left breast was performed from 11-3
o'clock demonstrating an oval, circumscribed, near anechoic mass at
11 o'clock, 4 cm from the nipple measuring 5 x 1 x 3 mm, likely
representing a small cluster of cysts. This finding likely
corresponds to the 6 mm oval mass seen in the superior left breast,
anterior depth on today's images. No suspicious cystic or solid
sonographic finding was seen in the area of concern in the superior
or lateral left breast.
IMPRESSION: Probably benign left breast finding.

RECOMMENDATION:
Left diagnostic mammogram and ultrasound in 6 months. As the patient
is planning for breast reduction surgery in October 2016, it was
recommended that she discuss the results of today's imaging with her
surgeon. If ultrasound-guided biopsy of the left breast mass at 11
o'clock, 4 cm from the nipple is desired prior to the patient's
reduction surgery, this can be performed.

I have discussed the findings and recommendations with the patient
and her husband. Results were also provided in writing at the
conclusion of the visit. If applicable, a reminder letter will be
sent to the patient regarding the next appointment.

BI-RADS CATEGORY  3: Probably benign.
